# Patient Record
Sex: Female | Born: 1970 | Race: Black or African American | Hispanic: No | Marital: Single | Smoking: Never smoker
Health system: Southern US, Community
[De-identification: ages and names within clinical notes are randomized; demographics above are authoritative.]

## PROBLEM LIST (undated history)

## (undated) DIAGNOSIS — E039 Hypothyroidism, unspecified: Secondary | ICD-10-CM

---

## 1998-03-03 ENCOUNTER — Other Ambulatory Visit: Admission: RE | Admit: 1998-03-03 | Discharge: 1998-03-03 | Payer: Self-pay | Admitting: Family Medicine

## 1998-12-22 ENCOUNTER — Other Ambulatory Visit: Admission: RE | Admit: 1998-12-22 | Discharge: 1998-12-22 | Payer: Self-pay | Admitting: Family Medicine

## 1999-10-26 ENCOUNTER — Emergency Department (HOSPITAL_COMMUNITY): Admission: EM | Admit: 1999-10-26 | Discharge: 1999-10-26 | Payer: Self-pay | Admitting: Emergency Medicine

## 1999-12-29 ENCOUNTER — Other Ambulatory Visit: Admission: RE | Admit: 1999-12-29 | Discharge: 1999-12-29 | Payer: Self-pay | Admitting: Family Medicine

## 2000-03-25 ENCOUNTER — Other Ambulatory Visit: Admission: RE | Admit: 2000-03-25 | Discharge: 2000-03-25 | Payer: Self-pay | Admitting: Family Medicine

## 2002-10-21 ENCOUNTER — Encounter: Admission: RE | Admit: 2002-10-21 | Discharge: 2002-10-21 | Payer: Self-pay | Admitting: Gastroenterology

## 2002-10-21 ENCOUNTER — Encounter: Payer: Self-pay | Admitting: Gastroenterology

## 2004-06-21 ENCOUNTER — Other Ambulatory Visit: Admission: RE | Admit: 2004-06-21 | Discharge: 2004-06-21 | Payer: Self-pay | Admitting: Family Medicine

## 2004-06-22 ENCOUNTER — Other Ambulatory Visit: Admission: RE | Admit: 2004-06-22 | Discharge: 2004-06-22 | Payer: Self-pay | Admitting: Family Medicine

## 2004-12-22 ENCOUNTER — Ambulatory Visit: Payer: Self-pay | Admitting: Family Medicine

## 2005-08-01 ENCOUNTER — Ambulatory Visit: Payer: Self-pay | Admitting: Family Medicine

## 2005-08-17 ENCOUNTER — Encounter: Payer: Self-pay | Admitting: Family Medicine

## 2005-08-17 ENCOUNTER — Ambulatory Visit: Payer: Self-pay | Admitting: Family Medicine

## 2005-08-17 ENCOUNTER — Other Ambulatory Visit: Admission: RE | Admit: 2005-08-17 | Discharge: 2005-08-17 | Payer: Self-pay | Admitting: Family Medicine

## 2006-07-03 ENCOUNTER — Ambulatory Visit: Payer: Self-pay | Admitting: Internal Medicine

## 2006-07-04 ENCOUNTER — Ambulatory Visit: Payer: Self-pay | Admitting: Cardiology

## 2006-07-05 ENCOUNTER — Ambulatory Visit: Payer: Self-pay | Admitting: Internal Medicine

## 2006-10-24 ENCOUNTER — Ambulatory Visit: Payer: Self-pay | Admitting: Family Medicine

## 2007-02-10 ENCOUNTER — Ambulatory Visit: Payer: Self-pay | Admitting: Family Medicine

## 2007-02-10 ENCOUNTER — Encounter (INDEPENDENT_AMBULATORY_CARE_PROVIDER_SITE_OTHER): Payer: Self-pay | Admitting: *Deleted

## 2007-02-10 ENCOUNTER — Other Ambulatory Visit: Admission: RE | Admit: 2007-02-10 | Discharge: 2007-02-10 | Payer: Self-pay | Admitting: Family Medicine

## 2007-02-10 LAB — CONVERTED CEMR LAB
ALT: 13 units/L (ref 0–40)
AST: 16 units/L (ref 0–37)
Albumin: 3.9 g/dL (ref 3.5–5.2)
Alkaline Phosphatase: 52 units/L (ref 39–117)
BUN: 10 mg/dL (ref 6–23)
Basophils Absolute: 0 10*3/uL (ref 0.0–0.1)
Basophils Relative: 0.1 % (ref 0.0–1.0)
Bilirubin, Direct: 0.1 mg/dL (ref 0.0–0.3)
CO2: 29 meq/L (ref 19–32)
Calcium: 9 mg/dL (ref 8.4–10.5)
Chloride: 108 meq/L (ref 96–112)
Cholesterol: 143 mg/dL (ref 0–200)
Creatinine, Ser: 0.7 mg/dL (ref 0.4–1.2)
Eosinophils Absolute: 0.1 10*3/uL (ref 0.0–0.6)
Eosinophils Relative: 2.6 % (ref 0.0–5.0)
GFR calc Af Amer: 122 mL/min
GFR calc non Af Amer: 101 mL/min
Glucose, Bld: 87 mg/dL (ref 70–99)
HCT: 38.5 % (ref 36.0–46.0)
HDL: 33.6 mg/dL — ABNORMAL LOW (ref >39.0)
Hemoglobin: 13.3 g/dL (ref 12.0–15.0)
LDL Cholesterol: 94 mg/dL (ref 0–99)
Lymphocytes Relative: 45.4 % (ref 12.0–46.0)
MCHC: 34.6 g/dL (ref 30.0–36.0)
MCV: 91.3 fL (ref 78.0–100.0)
Monocytes Absolute: 0.4 10*3/uL (ref 0.2–0.7)
Monocytes Relative: 7.2 % (ref 3.0–11.0)
Neutro Abs: 2.4 10*3/uL (ref 1.4–7.7)
Neutrophils Relative %: 44.7 % (ref 43.0–77.0)
Platelets: 309 10*3/uL (ref 150–400)
Potassium: 3.9 meq/L (ref 3.5–5.1)
RBC: 4.21 M/uL (ref 3.87–5.11)
RDW: 12 % (ref 11.5–14.6)
Sodium: 141 meq/L (ref 135–145)
TSH: 0.74 microintl units/mL (ref 0.35–5.50)
Total Bilirubin: 0.6 mg/dL (ref 0.3–1.2)
Total CHOL/HDL Ratio: 4.3
Total Protein: 7.1 g/dL (ref 6.0–8.3)
Triglycerides: 76 mg/dL (ref 0–149)
VLDL: 15 mg/dL (ref 0–40)
WBC: 5.3 10*3/uL (ref 4.5–10.5)

## 2007-07-14 ENCOUNTER — Ambulatory Visit (HOSPITAL_COMMUNITY): Admission: RE | Admit: 2007-07-14 | Discharge: 2007-07-14 | Payer: Self-pay | Admitting: Family Medicine

## 2007-07-16 ENCOUNTER — Encounter (INDEPENDENT_AMBULATORY_CARE_PROVIDER_SITE_OTHER): Payer: Self-pay | Admitting: *Deleted

## 2007-10-23 ENCOUNTER — Encounter: Admission: RE | Admit: 2007-10-23 | Discharge: 2007-10-23 | Payer: Self-pay | Admitting: Family Medicine

## 2008-11-24 ENCOUNTER — Other Ambulatory Visit: Admission: RE | Admit: 2008-11-24 | Discharge: 2008-11-24 | Payer: Self-pay | Admitting: Family Medicine

## 2008-11-24 ENCOUNTER — Ambulatory Visit: Payer: Self-pay | Admitting: Family Medicine

## 2008-11-24 ENCOUNTER — Encounter: Payer: Self-pay | Admitting: Family Medicine

## 2008-11-24 DIAGNOSIS — A6000 Herpesviral infection of urogenital system, unspecified: Secondary | ICD-10-CM | POA: Insufficient documentation

## 2008-11-29 ENCOUNTER — Encounter (INDEPENDENT_AMBULATORY_CARE_PROVIDER_SITE_OTHER): Payer: Self-pay | Admitting: *Deleted

## 2010-02-02 ENCOUNTER — Ambulatory Visit: Payer: Self-pay | Admitting: Family Medicine

## 2010-02-02 DIAGNOSIS — E039 Hypothyroidism, unspecified: Secondary | ICD-10-CM | POA: Insufficient documentation

## 2010-02-02 DIAGNOSIS — J069 Acute upper respiratory infection, unspecified: Secondary | ICD-10-CM | POA: Insufficient documentation

## 2010-02-02 LAB — CONVERTED CEMR LAB
ALT: 11 units/L (ref 0–35)
AST: 14 units/L (ref 0–37)
Albumin: 4.4 g/dL (ref 3.5–5.2)
Alkaline Phosphatase: 52 units/L (ref 39–117)
BUN: 8 mg/dL (ref 6–23)
Basophils Absolute: 0 10*3/uL (ref 0.0–0.1)
Basophils Relative: 0.5 % (ref 0.0–3.0)
Bilirubin, Direct: 0 mg/dL (ref 0.0–0.3)
CO2: 29 meq/L (ref 19–32)
Calcium: 9.6 mg/dL (ref 8.4–10.5)
Chloride: 106 meq/L (ref 96–112)
Cholesterol: 142 mg/dL (ref 0–200)
Creatinine, Ser: 0.8 mg/dL (ref 0.4–1.2)
Eosinophils Absolute: 0.1 10*3/uL (ref 0.0–0.7)
Eosinophils Relative: 2.6 % (ref 0.0–5.0)
GFR calc non Af Amer: 103.11 mL/min (ref >60)
Glucose, Bld: 90 mg/dL (ref 70–99)
HCT: 40 % (ref 36.0–46.0)
HDL: 35.6 mg/dL — ABNORMAL LOW (ref >39.00)
Hemoglobin: 13.5 g/dL (ref 12.0–15.0)
LDL Cholesterol: 87 mg/dL (ref 0–99)
Lymphocytes Relative: 40.5 % (ref 12.0–46.0)
Lymphs Abs: 2.2 10*3/uL (ref 0.7–4.0)
MCHC: 33.8 g/dL (ref 30.0–36.0)
MCV: 91.3 fL (ref 78.0–100.0)
Monocytes Absolute: 0.4 10*3/uL (ref 0.1–1.0)
Monocytes Relative: 7.5 % (ref 3.0–12.0)
Neutro Abs: 2.6 10*3/uL (ref 1.4–7.7)
Neutrophils Relative %: 48.9 % (ref 43.0–77.0)
Platelets: 340 10*3/uL (ref 150.0–400.0)
Potassium: 4.5 meq/L (ref 3.5–5.1)
RBC: 4.38 M/uL (ref 3.87–5.11)
RDW: 14.2 % (ref 11.5–14.6)
Sodium: 141 meq/L (ref 135–145)
TSH: 0.6 microintl units/mL (ref 0.35–5.50)
Total Bilirubin: 0.1 mg/dL — ABNORMAL LOW (ref 0.3–1.2)
Total CHOL/HDL Ratio: 4
Total Protein: 7.7 g/dL (ref 6.0–8.3)
Triglycerides: 97 mg/dL (ref 0.0–149.0)
VLDL: 19.4 mg/dL (ref 0.0–40.0)
WBC: 5.3 10*3/uL (ref 4.5–10.5)

## 2010-02-07 ENCOUNTER — Telehealth: Payer: Self-pay | Admitting: Family Medicine

## 2010-02-20 ENCOUNTER — Ambulatory Visit: Payer: Self-pay | Admitting: Family Medicine

## 2010-02-20 ENCOUNTER — Other Ambulatory Visit: Admission: RE | Admit: 2010-02-20 | Discharge: 2010-02-20 | Payer: Self-pay | Admitting: Family Medicine

## 2010-02-20 DIAGNOSIS — R319 Hematuria, unspecified: Secondary | ICD-10-CM | POA: Insufficient documentation

## 2010-02-24 ENCOUNTER — Telehealth (INDEPENDENT_AMBULATORY_CARE_PROVIDER_SITE_OTHER): Payer: Self-pay | Admitting: *Deleted

## 2010-02-24 LAB — CONVERTED CEMR LAB: Pap Smear: NEGATIVE

## 2010-03-10 ENCOUNTER — Ambulatory Visit: Payer: Self-pay | Admitting: Family Medicine

## 2010-03-10 LAB — CONVERTED CEMR LAB
Blood in Urine, dipstick: NEGATIVE
Glucose, Urine, Semiquant: NEGATIVE
Nitrite: NEGATIVE
Specific Gravity, Urine: <1.005

## 2010-03-11 ENCOUNTER — Encounter: Payer: Self-pay | Admitting: Family Medicine

## 2010-06-14 ENCOUNTER — Emergency Department (HOSPITAL_COMMUNITY)
Admission: EM | Admit: 2010-06-14 | Discharge: 2010-06-14 | Payer: Self-pay | Source: Home / Self Care | Admitting: Emergency Medicine

## 2010-11-16 NOTE — Assessment & Plan Note (Signed)
Summary: pap only//lch   Vital Signs:  Patient profile:   40 year old female Weight:      147 pounds Pulse rate:   82 / minute Pulse rhythm:   regular BP sitting:   114 / 80  (left arm) Cuff size:   regular  Vitals Entered By: Army Fossa CMA (Feb 20, 2010 10:05 AM) CC: Pap only.   History of Present Illness: Pt here for pap only.    Current Medications (verified): 1)  Valtrex 1 Gm Tabs (Valacyclovir Hcl) .... Take One Tablet By Mouth Daily 2)  Synthroid 125 Mcg Tabs (Levothyroxine Sodium) .... Take One Tablet Daily 3)  Zyrtec Allergy 10 Mg Tabs (Cetirizine Hcl) .Marland Kitchen.. 1 By Mouth Once Daily As Needed 4)  Singulair 10 Mg Tabs (Montelukast Sodium) .Marland Kitchen.. 1 By Mouth Once Daily 5)  Nasonex 50 Mcg/act Susp (Mometasone Furoate) 6)  Astepro 0.15 % Soln (Azelastine Hcl)  Allergies: 1)  ! Penicillin  Past History:  Past medical, surgical, family and social histories (including risk factors) reviewed for relevance to current acute and chronic problems.  Past Medical History: Reviewed history from 11/24/2008 and no changes required.  Current Problems:  HERPES GENITALIS (ICD-054.10)  Past Surgical History: Reviewed history from 11/24/2008 and no changes required. Denies surgical history  Family History: Reviewed history from 02/02/2010 and no changes required. Family History Hypertension Family History of Arthritis  Social History: Reviewed history from 11/24/2008 and no changes required. Occupation: [employer] Single Never Smoked Alcohol use-yes Drug use-no Regular exercise-yes  Review of Systems      See HPI  Physical Exam  General:  Well-developed,well-nourished,in no acute distress; alert,appropriate and cooperative throughout examination Genitalia:  Pelvic Exam:        External: normal female genitalia without lesions or masses        Vagina: normal without lesions or masses        Cervix: normal without lesions or masses        Adnexa:  normal bimanual exam without masses or fullness        Uterus: normal by palpation        Pap smear: performed + white d/c ---no odor   Impression & Recommendations:  Problem # 1:  ROUTINE GYNECOLOGICAL EXAMINATION (ICD-V72.31) pap done   Problem # 2:  HEMATURIA (ICD-599.70) recheck urine 2 weeks  Complete Medication List: 1)  Valtrex 1 Gm Tabs (Valacyclovir hcl) .... Take one tablet by mouth daily 2)  Synthroid 125 Mcg Tabs (Levothyroxine sodium) .... Take one tablet daily 3)  Zyrtec Allergy 10 Mg Tabs (Cetirizine hcl) .Marland Kitchen.. 1 by mouth once daily as needed 4)  Singulair 10 Mg Tabs (Montelukast sodium) .Marland Kitchen.. 1 by mouth once daily 5)  Nasonex 50 Mcg/act Susp (Mometasone furoate) 6)  Astepro 0.15 % Soln (Azelastine hcl)  Patient Instructions: 1)  recheck urine dip in 2 weeks--  599.70

## 2010-11-16 NOTE — Progress Notes (Signed)
Summary: Pap Results  Phone Note Outgoing Call Call back at Home Phone [phone]   Call placed by: Shonna Chock,  Feb 24, 2010 11:38 AM Call placed to: Patient Summary of Call: Left message on machine for patient to return call when avaliable, Reason for call:   normal except + yeast ---diflucan 150 mg  #2  1 by mouth once daily x1---may repeat in 3 days as needed  Chrae Malloy  Feb 24, 2010 11:39 AM   Follow-up for Phone Call        Patient is aware of results.   Pharmacy is [facility] on [address]  Follow-up by: Harold Barban,  Feb 24, 2010 12:45 PM    New/Updated Medications: DIFLUCAN 150 MG TABS (FLUCONAZOLE) 1 by mouth x 1, may repeat in 3 days if needed Prescriptions: DIFLUCAN 150 MG TABS (FLUCONAZOLE) 1 by mouth x 1, may repeat in 3 days if needed  #2 x 0   Entered by:   Shonna Chock   Authorized by:   Loreen Freud DO   Signed by:   Shonna Chock on 02/24/2010   Method used:   Electronically to        [facility] [address] [id number]* (retail)       [address] [place], [place]  [address]       Ph: [phone]       Fax: [phone]   RxID:   [id number]

## 2010-11-16 NOTE — Progress Notes (Signed)
Summary: Urine Culture Results   Phone Note Outgoing Call   Call placed by: Army Fossa CMA,  February 07, 2010 9:19 AM Reason for Call: Discuss lab or test results Summary of Call: Regarding lab results, LMTCB:  contaminated---  recheck if symptomatic Signed by Loreen Freud DO on 02/06/2010 at 9:22 PM  Follow-up for Phone Call        PATIENT RETURNED CALL, STATES SHE HAS NO UTI, OR URINARY SYMPTOMS.  SAYS SHE WAS ON HER PERIOD WHEN SHE DID THE URINE SAMPLE.  PER DANIELLE, FOR THAT REASON, SHE DOESN'T NEED TO COME BACK & REDO URINE SAMPLE, PATIENT IS AWARE. Follow-up by: Magdalen Spatz [facility],  February 07, 2010 10:06 AM

## 2010-11-16 NOTE — Assessment & Plan Note (Signed)
Summary: cpx/kdc   Vital Signs:  Patient profile:   40 year old female Height:      65 inches Weight:      147 pounds BMI:     24.55 Pulse rate:   76 / minute Pulse rhythm:   regular BP sitting:   112 / 72  (left arm) Cuff size:   regular  Vitals Entered By: Army Fossa CMA (February 02, 2010 10:10 AM) CC: Pt here for cpx, no pap- on period, not fasting.   History of Present Illness: Pt here for cpe and labs.  Pt started period last night.    pt c/o clear chest congestion and cough for 2 weeks --she is taking zyrtec.   Preventive Screening-Counseling & Management  Alcohol-Tobacco     Alcohol drinks/day: <1     Alcohol type: rare     Smoking Status: never  Caffeine-Diet-Exercise     Caffeine use/day: 1     Does Patient Exercise: yes     Type of exercise: walking     Exercise (avg: min/session): 30-60     Times/week: <3  Hep-HIV-STD-Contraception     Dental Visit-last 6 months yes     Dental Care Counseling: not indicated; dental care within six months     SBE monthly: yes     SBE Education/Counseling: not indicated; SBE done regularly  Safety-Violence-Falls     Seat Belt Use: 100      Sexual History:  single --not sexually active.        Drug Use:  never.    Current Medications (verified): 1)  Valtrex 1 Gm Tabs (Valacyclovir Hcl) .... Take One Tablet By Mouth Daily 2)  Synthroid 125 Mcg Tabs (Levothyroxine Sodium) .... Take One Tablet Daily 3)  Zyrtec Allergy 10 Mg Tabs (Cetirizine Hcl) .Marland Kitchen.. 1 By Mouth Once Daily As Needed 4)  Singulair 10 Mg Tabs (Montelukast Sodium) .Marland Kitchen.. 1 By Mouth Once Daily 5)  Nasonex 50 Mcg/act Susp (Mometasone Furoate) 6)  Astepro 0.15 % Soln (Azelastine Hcl)  Allergies: 1)  ! Penicillin  Past History:  Past Medical History: Last updated: 11/24/2008  Current Problems:  HERPES GENITALIS (ICD-054.10)  Past Surgical History: Last updated: 11/24/2008 Denies surgical history  Family History: Last updated: 02/02/2010 Family  History Hypertension Family History of Arthritis  Social History: Last updated: 11/24/2008 Occupation: [employer] Single Never Smoked Alcohol use-yes Drug use-no Regular exercise-yes  Risk Factors: Alcohol Use: <1 (02/02/2010) Caffeine Use: 1 (02/02/2010) Exercise: yes (02/02/2010)  Risk Factors: Smoking Status: never (02/02/2010)  Family History: Reviewed history from 11/24/2008 and no changes required. Family History Hypertension Family History of Arthritis  Social History: Reviewed history from 11/24/2008 and no changes required. Occupation: [employer] Single Never Smoked Alcohol use-yes Drug use-no Regular exercise-yes Dental Care w/in 6 mos.:  yes Sexual History:  single --not sexually active Drug Use:  never  Review of Systems      See HPI General:  Denies chills, fatigue, fever, loss of appetite, malaise, sleep disorder, sweats, weakness, and weight loss. Eyes:  Denies blurring, discharge, double vision, eye irritation, eye pain, halos, itching, light sensitivity, red eye, vision loss-1 eye, and vision loss-both eyes; optho-- q2-3 years. ENT:  Complains of postnasal drainage; denies decreased hearing, difficulty swallowing, ear discharge, earache, hoarseness, nasal congestion, nosebleeds, ringing in ears, sinus pressure, and sore throat. CV:  Denies bluish discoloration of lips or nails, chest pain or discomfort, difficulty breathing at night, difficulty breathing while lying down, fainting, fatigue, leg  cramps with exertion, lightheadness, near fainting, palpitations, shortness of breath with exertion, swelling of feet, swelling of hands, and weight gain. Resp:  Complains of cough and sputum productive; denies chest discomfort, chest pain with inspiration, coughing up blood, excessive snoring, hypersomnolence, morning headaches, pleuritic, shortness of breath, and wheezing; sputum is white/ clear. GI:  Denies abdominal pain, bloody  stools, change in bowel habits, constipation, dark tarry stools, diarrhea, excessive appetite, gas, hemorrhoids, indigestion, loss of appetite, and nausea. GU:  Denies abnormal vaginal bleeding, decreased libido, discharge, dysuria, genital sores, hematuria, incontinence, nocturia, urinary frequency, and urinary hesitancy. MS:  Denies joint pain, joint redness, joint swelling, loss of strength, low back pain, mid back pain, muscle aches, muscle , cramps, muscle weakness, stiffness, and thoracic pain. Derm:  Denies changes in color of skin, changes in nail beds, dryness, excessive perspiration, flushing, hair loss, insect bite(s), itching, lesion(s), poor wound healing, and rash. Neuro:  Denies brief paralysis, difficulty with concentration, disturbances in coordination, falling down, headaches, inability to speak, memory loss, numbness, poor balance, seizures, sensation of room spinning, tingling, tremors, visual disturbances, and weakness. Psych:  Denies alternate hallucination ( auditory/visual), anxiety, depression, easily angered, easily tearful, irritability, mental problems, panic attacks, sense of great danger, suicidal thoughts/plans, thoughts of violence, unusual visions or sounds, and thoughts /plans of harming others. Endo:  Denies cold intolerance, excessive hunger, excessive thirst, excessive urination, heat intolerance, polyuria, and weight change. Heme:  Denies abnormal bruising, bleeding, enlarge lymph nodes, fevers, pallor, and skin discoloration. Allergy:  Denies hives or rash, itching eyes, persistent infections, seasonal allergies, and sneezing.  Physical Exam  General:  Well-developed,well-nourished,in no acute distress; alert,appropriate and cooperative throughout examination Head:  Normocephalic and atraumatic without obvious abnormalities. No apparent alopecia or balding. Eyes:  pupils equal, pupils round, pupils reactive to light, and no injection.   Ears:  External ear exam  shows no significant lesions or deformities.  Otoscopic examination reveals clear canals, tympanic membranes are intact bilaterally without bulging, retraction, inflammation or discharge. Hearing is grossly normal bilaterally. Nose:  External nasal examination shows no deformity or inflammation. Nasal mucosa are pink and moist without lesions or exudates. Mouth:  Oral mucosa and oropharynx without lesions or exudates.  Teeth in good repair. Neck:  No deformities, masses, or tenderness noted. Chest Wall:  No deformities, masses, or tenderness noted. Breasts:  No mass, nodules, thickening, tenderness, bulging, retraction, inflamation, nipple discharge or skin changes noted.   Lungs:  Normal respiratory effort, chest expands symmetrically. Lungs are clear to auscultation, no crackles or wheezes. Heart:  normal rate and no murmur.   Abdomen:  Bowel sounds positive,abdomen soft and non-tender without masses, organomegaly or hernias noted. Genitalia:  deferred Msk:  No deformity or scoliosis noted of thoracic or lumbar spine.   Pulses:  R posterior tibial normal, R dorsalis pedis normal, R carotid normal, L posterior tibial normal, L dorsalis pedis normal, and L carotid normal.   Extremities:  No clubbing, cyanosis, edema, or deformity noted with normal full range of motion of all joints.   Neurologic:  alert & oriented X3, strength normal in all extremities, and gait normal.   Skin:  Intact without suspicious lesions or rashes Cervical Nodes:  No lymphadenopathy noted Axillary Nodes:  No palpable lymphadenopathy Psych:  Cognition and judgment appear intact. Alert and cooperative with normal attention span and concentration. No apparent delusions, illusions, hallucinations   Impression & Recommendations:  Problem # 1:  PREVENTIVE HEALTH CARE (ICD-V70.0) ghm utd rto pap 2-3 weeks  Orders: Venipuncture (21308) TLB-Lipid Panel (80061-LIPID) TLB-BMP (Basic Metabolic Panel-BMET)  (80048-METABOL) TLB-CBC Platelet - w/Differential (85025-CBCD) TLB-Hepatic/Liver Function Pnl (80076-HEPATIC) TLB-TSH (Thyroid Stimulating Hormone) (84443-TSH)  Problem # 2:  HERPES GENITALIS (ICD-054.10) refill valtrex  Problem # 3:  HYPOTHYROIDISM (ICD-244.9)  Her updated medication list for this problem includes:    Synthroid 125 Mcg Tabs (Levothyroxine sodium) .Marland Kitchen... Take one tablet daily  Labs Reviewed: TSH: 0.74 (02/10/2007)    Chol: 143 (02/10/2007)   HDL: 33.6 (02/10/2007)   LDL: 94 (02/10/2007)   TG: 76 (02/10/2007)  Problem # 4:  URI (ICD-465.9)  ? allergies---  nasonex. astepro, singulair---con't zyrtec Her updated medication list for this problem includes:    Zyrtec Allergy 10 Mg Tabs (Cetirizine hcl) .Marland Kitchen... 1 by mouth once daily as needed  Instructed on symptomatic treatment. Call if symptoms persist or worsen.   Complete Medication List: 1)  Valtrex 1 Gm Tabs (Valacyclovir hcl) .... Take one tablet by mouth daily 2)  Synthroid 125 Mcg Tabs (Levothyroxine sodium) .... Take one tablet daily 3)  Zyrtec Allergy 10 Mg Tabs (Cetirizine hcl) .Marland Kitchen.. 1 by mouth once daily as needed 4)  Singulair 10 Mg Tabs (Montelukast sodium) .Marland Kitchen.. 1 by mouth once daily 5)  Nasonex 50 Mcg/act Susp (Mometasone furoate) 6)  Astepro 0.15 % Soln (Azelastine hcl) Prescriptions: VALTREX 1 GM TABS (VALACYCLOVIR HCL) TAKE ONE TABLET BY MOUTH DAILY  #60 Tablet x 5   Entered and Authorized by:   Loreen Freud DO   Signed by:   Loreen Freud DO on 02/02/2010   Method used:   Electronically to        [facility] [phone]* (retail)       [address], [place]  [address]       Ph: [phone] or [phone]       Fax: [phone]   RxID:   [id number]   TD Result Date:  01/24/2005    Immunization History:  Tetanus/Td Immunization History:    Tetanus/Td:  Historical (01/24/2005)  Appended Document: cpx/kdc  Laboratory Results   Urine Tests   Date/Time Reported: February 02, 2010 12:47 PM   Routine Urinalysis   Color: yellow Appearance: Clear Glucose: negative   (Normal Range: Negative) Bilirubin: negative   (Normal Range: Negative) Ketone: negative   (Normal Range: Negative) Spec. Gravity: >=1.030   (Normal Range: 1.003-1.035) Blood: large   (Normal Range: Negative) pH: 5.0   (Normal Range: 5.0-8.0) Protein: 30   (Normal Range: Negative) Urobilinogen: negative   (Normal Range: 0-1) Nitrite: negative   (Normal Range: Negative) Leukocyte Esterace: negative   (Normal Range: Negative)    Comments: Floydene Flock  February 02, 2010 12:48 PM cx sent

## 2011-02-23 ENCOUNTER — Ambulatory Visit (INDEPENDENT_AMBULATORY_CARE_PROVIDER_SITE_OTHER): Payer: BC Managed Care – PPO | Admitting: Family Medicine

## 2011-02-23 ENCOUNTER — Encounter: Payer: Self-pay | Admitting: Family Medicine

## 2011-02-23 ENCOUNTER — Other Ambulatory Visit (HOSPITAL_COMMUNITY)
Admission: RE | Admit: 2011-02-23 | Discharge: 2011-02-23 | Disposition: A | Discharge status: Home / Self Care | Payer: BC Managed Care – PPO | Source: Ambulatory Visit | Attending: Family Medicine | Admitting: Family Medicine

## 2011-02-23 VITALS — BP 114/76 | HR 89 | Temp 98.7°F | Wt 150.8 lb

## 2011-02-23 DIAGNOSIS — Z01419 Encounter for gynecological examination (general) (routine) without abnormal findings: Secondary | ICD-10-CM

## 2011-02-23 DIAGNOSIS — A6000 Herpesviral infection of urogenital system, unspecified: Secondary | ICD-10-CM

## 2011-02-23 MED ORDER — VALACYCLOVIR HCL 1 G PO TABS: 1000.0000 mg | ORAL_TABLET | Freq: Every day | ORAL | Status: DC

## 2011-02-23 NOTE — Progress Notes (Signed)
  Subjective:     Lindsay Avila is a 40 y.o. female and is here for a comprehensive physical exam. The patient reports no problems.  History   Social History  . Marital Status: Single    Spouse Name: N/A    Number of Children: N/A  . Years of Education: N/A   Occupational History  . Not on file.   Social History Main Topics  . Smoking status: Never Smoker   . Smokeless tobacco: Never Used  . Alcohol Use: Yes  . Drug Use: No  . Sexually Active: Yes   Other Topics Concern  . Not on file   Social History Narrative  . No narrative on file   Health Maintenance  Topic Date Due  . Pap Smear  11/11/1989  . Tetanus/tdap  01/25/2015    The following portions of the patient's history were reviewed and updated as appropriate: allergies, current medications, past family history, past medical history, past social history, past surgical history and problem list.  Review of Systems Review of Systems  Constitutional: Negative for activity change, appetite change and fatigue.  HENT: Negative for hearing loss, congestion, tinnitus and ear discharge.   Eyes: Negative for visual disturbance  Respiratory: Negative for cough, chest tightness and shortness of breath.   Cardiovascular: Negative for chest pain, palpitations and leg swelling.  Gastrointestinal: Negative for abdominal pain, diarrhea, constipation and abdominal distention.  Genitourinary: Negative for urgency, frequency, decreased urine volume and difficulty urinating.  Musculoskeletal: Negative for back pain, arthralgias and gait problem.  Skin: Negative for color change, pallor and rash.  Neurological: Negative for dizziness, light-headedness, numbness and headaches.  Hematological: Negative for adenopathy. Does not bruise/bleed easily.  Psychiatric/Behavioral: Negative for suicidal ideas, confusion, sleep disturbance, self-injury, dysphoric mood, decreased concentration and agitation.      Objective:    BP 114/76   Pulse 89  Temp(Src) 98.7 F (37.1 C) (Oral)  Wt 150 lb 12.8 oz (68.402 kg)  SpO2 98%  LMP 02/17/2011 General appearance: alert, cooperative, appears stated age and no distress Breasts: normal appearance, no masses or tenderness Pelvic: cervix normal in appearance, external genitalia normal, no adnexal masses or tenderness, no cervical motion tenderness, rectovaginal septum normal, uterus normal size, shape, and consistency and vagina normal without discharge    Assessment:    Healthy female exam.    Plan:    pap smear and breast exam only --at pt request  pt did not want labs either See After Visit Summary for Counseling Recommendations

## 2011-02-28 ENCOUNTER — Encounter: Payer: Self-pay | Admitting: *Deleted

## 2011-03-13 ENCOUNTER — Other Ambulatory Visit: Payer: Self-pay

## 2011-03-13 ENCOUNTER — Telehealth: Payer: Self-pay | Admitting: *Deleted

## 2011-03-13 MED ORDER — FLUCONAZOLE 150 MG PO TABS: ORAL_TABLET | ORAL | Status: DC

## 2011-03-13 NOTE — Telephone Encounter (Signed)
Diflucan 150mg #2  1 po qd x1, may repeat 3 days prn  

## 2011-03-13 NOTE — Telephone Encounter (Signed)
This time only---wants to use different pharmacy---please send this prescription to [facility] at [address] of [address], [place]

## 2011-03-13 NOTE — Telephone Encounter (Signed)
Pt states that she was told to call if symptoms did not improve. Pt now c/o yeast infection and would like to have med Rx. Pt seen on 02-23-11 for CPX. Please advise

## 2011-03-13 NOTE — Telephone Encounter (Signed)
Left Pt detail message Rx sent to pharmacy.  

## 2011-03-14 MED ORDER — FLUCONAZOLE 150 MG PO TABS: ORAL_TABLET | ORAL | Status: DC

## 2011-03-14 NOTE — Telephone Encounter (Signed)
Refaxed   KP 

## 2011-03-14 NOTE — Telephone Encounter (Signed)
Addended by: Arnette Norris on: 03/14/2011 04:48 PM   Modules accepted: Orders

## 2012-01-10 ENCOUNTER — Other Ambulatory Visit (HOSPITAL_COMMUNITY): Payer: Self-pay | Admitting: Nurse Practitioner

## 2012-01-10 DIAGNOSIS — Z1231 Encounter for screening mammogram for malignant neoplasm of breast: Secondary | ICD-10-CM

## 2012-01-11 ENCOUNTER — Ambulatory Visit (HOSPITAL_COMMUNITY)
Admission: RE | Admit: 2012-01-11 | Discharge: 2012-01-11 | Disposition: A | Discharge status: Home / Self Care | Payer: BC Managed Care – PPO | Source: Ambulatory Visit | Attending: Nurse Practitioner | Admitting: Nurse Practitioner

## 2012-01-11 DIAGNOSIS — Z1231 Encounter for screening mammogram for malignant neoplasm of breast: Secondary | ICD-10-CM | POA: Insufficient documentation

## 2012-07-10 ENCOUNTER — Encounter (HOSPITAL_COMMUNITY): Payer: Self-pay | Admitting: Pharmacist

## 2012-07-11 NOTE — H&P (Signed)
Lindsay Avila is an 41 y.o. female.   Chief Complaint: bleeding   HPI: 41 yo SBF G0P0 with menorrhagia and large fibroids, for R-TLH.  PUS showed uterus 9 x 8 x 17 cm with a 9 cm fibroid.  Adnexa normal.  Past Medical History  Diagnosis Date  . Genital herpes     No past surgical history on file.  Family History  Problem Relation Age of Onset  . Hypertension    . Arthritis     Social History:  reports that she has never smoked. She has never used smokeless tobacco. She reports that she drinks alcohol. She reports that she does not use illicit drugs.works at a part time job unloading trucks, which involves heavy lifting at times.  She also works as a Surveyor, mining.  Allergies:  Allergies  Allergen Reactions  . Penicillins     No prescriptions prior to admission    No results found for this or any previous visit (from the past 48 hour(s)). No results found.  Review of Systems  All other systems reviewed and are negative.    There were no vitals taken for this visit. Physical Exam  Constitutional: She is oriented to person, place, and time. She appears well-developed and well-nourished.  HENT:  Head: Normocephalic and atraumatic.  Eyes: Conjunctivae normal are normal.  Neck: No thyromegaly present.  Cardiovascular: Normal rate, regular rhythm and normal heart sounds.   Respiratory: Effort normal and breath sounds normal.  GI: Soft. She exhibits mass. She exhibits no distension. There is no tenderness.       Uterus palpable just at umbilicus  Genitourinary: Vagina normal.       Uterus at umbilicus.  Adnexa not palpable.  Musculoskeletal: Normal range of motion.  Neurological: She is alert and oriented to person, place, and time.  Skin: Skin is warm and dry.  Psychiatric: Her behavior is normal. Thought content normal.     Assessment/Plan Large fibroid uterus.  Plan R-TLH, cystoscopy.  ROMINE,CYNTHIA P 07/11/2012, 12:23 PM

## 2012-07-21 ENCOUNTER — Encounter (HOSPITAL_COMMUNITY)
Admission: RE | Admit: 2012-07-21 | Discharge: 2012-07-21 | Disposition: A | Discharge status: Home / Self Care | Payer: BC Managed Care – PPO | Source: Ambulatory Visit | Attending: Obstetrics and Gynecology | Admitting: Obstetrics and Gynecology

## 2012-07-21 ENCOUNTER — Encounter (HOSPITAL_COMMUNITY): Payer: Self-pay

## 2012-07-21 HISTORY — DX: Hypothyroidism, unspecified: E03.9

## 2012-07-21 LAB — CBC
HCT: 40 % (ref 36.0–46.0)
MCH: 28.8 pg (ref 26.0–34.0)
MCHC: 33 g/dL (ref 30.0–36.0)
MCV: 87.3 fL (ref 78.0–100.0)
Platelets: 338 10*3/uL (ref 150–400)
RDW: 12.8 % (ref 11.5–15.5)

## 2012-07-21 NOTE — Patient Instructions (Addendum)
20 Lindsay Avila  07/21/2012   Your procedure is scheduled on:  07/28/12  Enter through the Main Entrance of [facility] at 6 AM.  Pick up the phone at the desk and dial [phone].   Call this number if you have problems the morning of surgery: [phone]   Remember:   Do not eat food:After Midnight.  Do not drink clear liquids: After Midnight.  Take these medicines the morning of surgery with A SIP OF WATER: Thyroid medication   Do not wear jewelry, make-up or nail polish.  Do not wear lotions, powders, or perfumes. You may wear deodorant.  Do not shave 48 hours prior to surgery.  Do not bring valuables to the hospital.  Contacts, dentures or bridgework may not be worn into surgery.  Leave suitcase in the car. After surgery it may be brought to your room.  For patients admitted to the hospital, checkout time is 11:00 AM the day of discharge.   Patients discharged the day of surgery will not be allowed to drive home.  Name and phone number of your driver: NA  Special Instructions: Shower using CHG 2 nights before surgery and the night before surgery.  If you shower the day of surgery use CHG.  Use special wash - you have one bottle of CHG for all showers.  You should use approximately 1/3 of the bottle for each shower.   Please read over the following fact sheets that you were given: MRSA Information

## 2012-07-27 MED ORDER — CIPROFLOXACIN IN D5W 400 MG/200ML IV SOLN
400.0000 mg | INTRAVENOUS | Status: AC
  Administered 2012-07-28: 400 mg via INTRAVENOUS
  Filled 2012-07-27: qty 200

## 2012-07-27 MED ORDER — METRONIDAZOLE IN NACL 5-0.79 MG/ML-% IV SOLN
500.0000 mg | Freq: Once | INTRAVENOUS | Status: AC
  Administered 2012-07-28: 500 mg via INTRAVENOUS
  Filled 2012-07-27: qty 100

## 2012-07-28 ENCOUNTER — Ambulatory Visit (HOSPITAL_COMMUNITY)
Admission: RE | Admit: 2012-07-28 | Discharge: 2012-07-28 | Disposition: A | Discharge status: Home / Self Care | Payer: BC Managed Care – PPO | Source: Ambulatory Visit | Attending: Obstetrics and Gynecology | Admitting: Obstetrics and Gynecology

## 2012-07-28 ENCOUNTER — Encounter (HOSPITAL_COMMUNITY): Payer: Self-pay | Admitting: *Deleted

## 2012-07-28 ENCOUNTER — Encounter (HOSPITAL_COMMUNITY): Payer: Self-pay | Admitting: Registered Nurse

## 2012-07-28 ENCOUNTER — Ambulatory Visit (HOSPITAL_COMMUNITY): Payer: BC Managed Care – PPO | Admitting: Registered Nurse

## 2012-07-28 ENCOUNTER — Encounter (HOSPITAL_COMMUNITY)
Admission: RE | Disposition: A | Discharge status: Home / Self Care | Payer: Self-pay | Source: Ambulatory Visit | Attending: Obstetrics and Gynecology

## 2012-07-28 DIAGNOSIS — N80109 Endometriosis of ovary, unspecified side, unspecified depth: Secondary | ICD-10-CM | POA: Insufficient documentation

## 2012-07-28 DIAGNOSIS — D259 Leiomyoma of uterus, unspecified: Secondary | ICD-10-CM | POA: Insufficient documentation

## 2012-07-28 DIAGNOSIS — N801 Endometriosis of ovary: Secondary | ICD-10-CM | POA: Insufficient documentation

## 2012-07-28 DIAGNOSIS — N920 Excessive and frequent menstruation with regular cycle: Secondary | ICD-10-CM | POA: Insufficient documentation

## 2012-07-28 HISTORY — PX: ABDOMINAL HYSTERECTOMY: SHX81

## 2012-07-28 HISTORY — PX: CYSTOSCOPY: SHX5120

## 2012-07-28 LAB — HCG, SERUM, QUALITATIVE: Preg, Serum: NEGATIVE

## 2012-07-28 SURGERY — ROBOTIC ASSISTED TOTAL HYSTERECTOMY
Anesthesia: General | Site: Bladder | Wound class: Clean Contaminated

## 2012-07-28 MED ORDER — DEXAMETHASONE SODIUM PHOSPHATE 10 MG/ML IJ SOLN
INTRAMUSCULAR | Status: DC | PRN
  Administered 2012-07-28: 10 mg via INTRAVENOUS

## 2012-07-28 MED ORDER — FENTANYL CITRATE 0.05 MG/ML IJ SOLN
INTRAMUSCULAR | Status: AC
  Filled 2012-07-28: qty 5

## 2012-07-28 MED ORDER — LIDOCAINE HCL (CARDIAC) 20 MG/ML IV SOLN
INTRAVENOUS | Status: AC
  Filled 2012-07-28: qty 5

## 2012-07-28 MED ORDER — KETOROLAC TROMETHAMINE 30 MG/ML IJ SOLN: 15.0000 mg | Freq: Once | INTRAMUSCULAR | Status: DC | PRN

## 2012-07-28 MED ORDER — ARTIFICIAL TEARS OP OINT
TOPICAL_OINTMENT | OPHTHALMIC | Status: DC | PRN
  Administered 2012-07-28: 1 via OPHTHALMIC

## 2012-07-28 MED ORDER — MIDAZOLAM HCL 2 MG/2ML IJ SOLN
INTRAMUSCULAR | Status: AC
  Filled 2012-07-28: qty 2

## 2012-07-28 MED ORDER — ACETAMINOPHEN 10 MG/ML IV SOLN
1000.0000 mg | Freq: Once | INTRAVENOUS | Status: DC
  Filled 2012-07-28: qty 100

## 2012-07-28 MED ORDER — OXYCODONE-ACETAMINOPHEN 5-325 MG PO TABS
1.0000 | ORAL_TABLET | ORAL | Status: DC | PRN
  Administered 2012-07-28 (×2): 2 via ORAL
  Filled 2012-07-28 (×2): qty 2

## 2012-07-28 MED ORDER — ONDANSETRON HCL 4 MG/2ML IJ SOLN
INTRAMUSCULAR | Status: AC
  Filled 2012-07-28: qty 2

## 2012-07-28 MED ORDER — DEXTROSE IN LACTATED RINGERS 5 % IV SOLN: INTRAVENOUS | Status: DC

## 2012-07-28 MED ORDER — FENTANYL CITRATE 0.05 MG/ML IJ SOLN
INTRAMUSCULAR | Status: AC
  Administered 2012-07-28: 50 ug via INTRAVENOUS
  Filled 2012-07-28: qty 2

## 2012-07-28 MED ORDER — STERILE WATER FOR IRRIGATION IR SOLN
Status: DC | PRN
  Administered 2012-07-28: 200 mL via INTRAVESICAL

## 2012-07-28 MED ORDER — INFLUENZA VIRUS VACC SPLIT PF IM SUSP
0.5000 mL | Freq: Once | INTRAMUSCULAR | Status: AC
  Administered 2012-07-28: 0.5 mL via INTRAMUSCULAR
  Filled 2012-07-28: qty 0.5

## 2012-07-28 MED ORDER — PROPOFOL 10 MG/ML IV EMUL
INTRAVENOUS | Status: AC
  Filled 2012-07-28: qty 20

## 2012-07-28 MED ORDER — GLYCOPYRROLATE 0.2 MG/ML IJ SOLN
INTRAMUSCULAR | Status: AC
  Filled 2012-07-28: qty 3

## 2012-07-28 MED ORDER — ONDANSETRON HCL 4 MG/2ML IJ SOLN
INTRAMUSCULAR | Status: DC | PRN
  Administered 2012-07-28: 4 mg via INTRAVENOUS

## 2012-07-28 MED ORDER — FENTANYL CITRATE 0.05 MG/ML IJ SOLN
INTRAMUSCULAR | Status: DC | PRN
  Administered 2012-07-28: 25 ug via INTRAVENOUS
  Administered 2012-07-28: 150 ug via INTRAVENOUS
  Administered 2012-07-28: 25 ug via INTRAVENOUS
  Administered 2012-07-28: 50 ug via INTRAVENOUS

## 2012-07-28 MED ORDER — ACETAMINOPHEN 10 MG/ML IV SOLN
1000.0000 mg | Freq: Once | INTRAVENOUS | Status: AC
  Administered 2012-07-28: 1000 mg via INTRAVENOUS
  Filled 2012-07-28: qty 100

## 2012-07-28 MED ORDER — LACTATED RINGERS IR SOLN
Status: DC | PRN
  Administered 2012-07-28: 3000 mL

## 2012-07-28 MED ORDER — ARTIFICIAL TEARS OP OINT
TOPICAL_OINTMENT | OPHTHALMIC | Status: AC
  Filled 2012-07-28: qty 3.5

## 2012-07-28 MED ORDER — NEOSTIGMINE METHYLSULFATE 1 MG/ML IJ SOLN
INTRAMUSCULAR | Status: AC
  Filled 2012-07-28: qty 10

## 2012-07-28 MED ORDER — INDIGOTINDISULFONATE SODIUM 8 MG/ML IJ SOLN
INTRAMUSCULAR | Status: AC
  Filled 2012-07-28: qty 5

## 2012-07-28 MED ORDER — LIDOCAINE HCL (CARDIAC) 20 MG/ML IV SOLN
INTRAVENOUS | Status: DC | PRN
  Administered 2012-07-28: 100 mg via INTRAVENOUS

## 2012-07-28 MED ORDER — SODIUM CHLORIDE 0.9 % IJ SOLN
INTRAMUSCULAR | Status: DC | PRN
  Administered 2012-07-28: 10 mL

## 2012-07-28 MED ORDER — PROPOFOL 10 MG/ML IV BOLUS
INTRAVENOUS | Status: DC | PRN
  Administered 2012-07-28: 200 mg via INTRAVENOUS

## 2012-07-28 MED ORDER — LACTATED RINGERS IV SOLN
INTRAVENOUS | Status: DC
  Administered 2012-07-28 (×2): via INTRAVENOUS

## 2012-07-28 MED ORDER — ROPIVACAINE HCL 5 MG/ML IJ SOLN
INTRAMUSCULAR | Status: AC
  Filled 2012-07-28: qty 60

## 2012-07-28 MED ORDER — NEOSTIGMINE METHYLSULFATE 1 MG/ML IJ SOLN
INTRAMUSCULAR | Status: DC | PRN
  Administered 2012-07-28: 3 mg via INTRAVENOUS

## 2012-07-28 MED ORDER — ROCURONIUM BROMIDE 100 MG/10ML IV SOLN
INTRAVENOUS | Status: DC | PRN
  Administered 2012-07-28 (×2): 10 mg via INTRAVENOUS
  Administered 2012-07-28: 20 mg via INTRAVENOUS
  Administered 2012-07-28: 60 mg via INTRAVENOUS

## 2012-07-28 MED ORDER — VASOPRESSIN 20 UNIT/ML IJ SOLN
INTRAMUSCULAR | Status: AC
  Filled 2012-07-28: qty 1

## 2012-07-28 MED ORDER — ROPIVACAINE HCL 5 MG/ML IJ SOLN
INTRAMUSCULAR | Status: DC | PRN
  Administered 2012-07-28: 120 mL

## 2012-07-28 MED ORDER — FENTANYL CITRATE 0.05 MG/ML IJ SOLN
25.0000 ug | INTRAMUSCULAR | Status: DC | PRN
  Administered 2012-07-28: 50 ug via INTRAVENOUS
  Administered 2012-07-28: 25 ug via INTRAVENOUS

## 2012-07-28 MED ORDER — GLYCOPYRROLATE 0.2 MG/ML IJ SOLN
INTRAMUSCULAR | Status: DC | PRN
  Administered 2012-07-28: 0.6 mg via INTRAVENOUS

## 2012-07-28 MED ORDER — DEXAMETHASONE SODIUM PHOSPHATE 10 MG/ML IJ SOLN
INTRAMUSCULAR | Status: AC
  Filled 2012-07-28: qty 1

## 2012-07-28 MED ORDER — ACETAMINOPHEN 10 MG/ML IV SOLN
INTRAVENOUS | Status: AC
  Filled 2012-07-28: qty 100

## 2012-07-28 MED ORDER — INDIGOTINDISULFONATE SODIUM 8 MG/ML IJ SOLN
INTRAMUSCULAR | Status: DC | PRN
  Administered 2012-07-28: 5 mL via INTRAVENOUS

## 2012-07-28 MED ORDER — MIDAZOLAM HCL 5 MG/5ML IJ SOLN
INTRAMUSCULAR | Status: DC | PRN
  Administered 2012-07-28: 2 mg via INTRAVENOUS

## 2012-07-28 MED ORDER — ROCURONIUM BROMIDE 50 MG/5ML IV SOLN
INTRAVENOUS | Status: AC
  Filled 2012-07-28: qty 1

## 2012-07-28 SURGICAL SUPPLY — 52 items
BARRIER ADHS 3X4 INTERCEED (GAUZE/BANDAGES/DRESSINGS) ×4 IMPLANT
BLADE MORCELLATOR EXT  12.5X15 (ELECTROSURGICAL) ×1
BLADE MORCELLATOR EXT 12.5X15 (ELECTROSURGICAL) ×3 IMPLANT
CABLE HIGH FREQUENCY MONO STRZ (ELECTRODE) ×4 IMPLANT
CHLORAPREP W/TINT 26ML (MISCELLANEOUS) ×4 IMPLANT
CONT PATH 16OZ SNAP LID 3702 (MISCELLANEOUS) ×4 IMPLANT
COVER MAYO STAND STRL (DRAPES) ×4 IMPLANT
COVER TABLE BACK 60X90 (DRAPES) ×8 IMPLANT
COVER TIP SHEARS 8 DVNC (MISCELLANEOUS) ×3 IMPLANT
COVER TIP SHEARS 8MM DA VINCI (MISCELLANEOUS) ×1
DECANTER SPIKE VIAL GLASS SM (MISCELLANEOUS) ×8 IMPLANT
DERMABOND ADVANCED (GAUZE/BANDAGES/DRESSINGS) ×1
DERMABOND ADVANCED .7 DNX12 (GAUZE/BANDAGES/DRESSINGS) ×3 IMPLANT
DRAPE HUG U DISPOSABLE (DRAPE) ×4 IMPLANT
DRAPE LG THREE QUARTER DISP (DRAPES) ×8 IMPLANT
DRAPE WARM FLUID 44X44 (DRAPE) ×4 IMPLANT
ELECT REM PT RETURN 9FT ADLT (ELECTROSURGICAL) ×4
ELECTRODE REM PT RTRN 9FT ADLT (ELECTROSURGICAL) ×3 IMPLANT
EVACUATOR SMOKE 8.L (FILTER) ×4 IMPLANT
GLOVE BIOGEL PI IND STRL 7.0 (GLOVE) ×12 IMPLANT
GLOVE BIOGEL PI INDICATOR 7.0 (GLOVE) ×4
GLOVE ECLIPSE 6.5 STRL STRAW (GLOVE) ×28 IMPLANT
GOWN STRL REIN XL XLG (GOWN DISPOSABLE) ×32 IMPLANT
KIT ACCESSORY DA VINCI DISP (KITS) ×1
KIT ACCESSORY DVNC DISP (KITS) ×3 IMPLANT
NEEDLE INSUFFLATION 14GA 120MM (NEEDLE) ×4 IMPLANT
OCCLUDER COLPOPNEUMO (BALLOONS) ×4 IMPLANT
PACK LAVH (CUSTOM PROCEDURE TRAY) ×4 IMPLANT
PAD PREP 24X48 CUFFED NSTRL (MISCELLANEOUS) ×8 IMPLANT
PROTECTOR NERVE ULNAR (MISCELLANEOUS) ×8 IMPLANT
SET CYSTO W/LG BORE CLAMP LF (SET/KITS/TRAYS/PACK) ×4 IMPLANT
SET IRRIG TUBING LAPAROSCOPIC (IRRIGATION / IRRIGATOR) ×8 IMPLANT
SOLUTION ELECTROLUBE (MISCELLANEOUS) ×4 IMPLANT
SUT VIC AB 0 CT1 27 (SUTURE) ×2
SUT VIC AB 0 CT1 27XBRD ANBCTR (SUTURE) ×6 IMPLANT
SUT VICRYL 0 UR6 27IN ABS (SUTURE) ×4 IMPLANT
SUT VICRYL RAPIDE 4/0 PS 2 (SUTURE) ×8 IMPLANT
SUT VLOC 180 0 9IN  GS21 (SUTURE)
SUT VLOC 180 0 9IN GS21 (SUTURE) IMPLANT
SYR 30ML LL (SYRINGE) ×4 IMPLANT
SYR 50ML LL SCALE MARK (SYRINGE) ×4 IMPLANT
SYR TB 1ML 25GX5/8 (SYRINGE) ×8 IMPLANT
TIP UTERINE 6.7X8CM BLUE DISP (MISCELLANEOUS) ×4 IMPLANT
TOWEL OR 17X24 6PK STRL BLUE (TOWEL DISPOSABLE) ×12 IMPLANT
TRAY FOLEY BAG SILVER LF 14FR (CATHETERS) ×4 IMPLANT
TROCAR DISP BLADELESS 8 DVNC (TROCAR) ×3 IMPLANT
TROCAR DISP BLADELESS 8MM (TROCAR) ×1
TROCAR XCEL NON-BLD 5MMX100MML (ENDOMECHANICALS) ×4 IMPLANT
TROCAR Z THRD FIOS 12X150 (TROCAR) ×4 IMPLANT
TUBING FILTER THERMOFLATOR (ELECTROSURGICAL) ×4 IMPLANT
WARMER LAPAROSCOPE (MISCELLANEOUS) ×4 IMPLANT
WATER STERILE IRR 1000ML POUR (IV SOLUTION) ×12 IMPLANT

## 2012-07-28 NOTE — Anesthesia Preprocedure Evaluation (Signed)
Anesthesia Evaluation  Patient identified by MRN, date of birth, ID band Patient awake    Reviewed: Allergy & Precautions, H&P , NPO status , Patient's Chart, lab work & pertinent test results, reviewed documented beta blocker date and time   History of Anesthesia Complications Negative for: history of anesthetic complications  Airway Mallampati: II TM Distance: >3 FB Neck ROM: full    Dental  (+) Teeth Intact   Pulmonary neg pulmonary ROS,  breath sounds clear to auscultation  Pulmonary exam normal       Cardiovascular Exercise Tolerance: Good negative cardio ROS  Rhythm:regular Rate:Normal     Neuro/Psych negative neurological ROS  negative psych ROS   GI/Hepatic negative GI ROS, Neg liver ROS,   Endo/Other  Hypothyroidism   Renal/GU negative Renal ROS  Female GU complaint     Musculoskeletal   Abdominal   Peds  Hematology negative hematology ROS (+)   Anesthesia Other Findings   Reproductive/Obstetrics negative OB ROS                           Anesthesia Physical Anesthesia Plan  ASA: II  Anesthesia Plan: General ETT   Post-op Pain Management:    Induction:   Airway Management Planned:   Additional Equipment:   Intra-op Plan:   Post-operative Plan:   Informed Consent: I have reviewed the patients History and Physical, chart, labs and discussed the procedure including the risks, benefits and alternatives for the proposed anesthesia with the patient or authorized representative who has indicated his/her understanding and acceptance.   Dental Advisory Given  Plan Discussed with: CRNA and Surgeon  Anesthesia Plan Comments:         Anesthesia Quick Evaluation

## 2012-07-28 NOTE — Transfer of Care (Signed)
Immediate Anesthesia Transfer of Care Note  Patient: Lindsay Avila  Procedure(s) Performed: Procedure(s) (LRB) with comments: ROBOTIC ASSISTED TOTAL HYSTERECTOMY (N/A) BILATERAL SALPINGECTOMY (Bilateral) CYSTOSCOPY ()  Patient Location: PACU  Anesthesia Type: General  Level of Consciousness: sedated  Airway & Oxygen Therapy: Patient Spontanous Breathing and Patient connected to nasal cannula oxygen  Post-op Assessment: Report given to PACU RN  Post vital signs: Reviewed  Complications: No apparent anesthesia complications

## 2012-07-28 NOTE — Preoperative (Signed)
Beta Blockers   Reason not to administer Beta Blockers:Not Applicable 

## 2012-07-28 NOTE — Anesthesia Postprocedure Evaluation (Signed)
  Anesthesia Post-op Note  Patient: Lindsay Avila  Procedure(s) Performed: Procedure(s) (LRB) with comments: ROBOTIC ASSISTED TOTAL HYSTERECTOMY (N/A) BILATERAL SALPINGECTOMY (Bilateral) CYSTOSCOPY ()  Patient Location: PACU  Anesthesia Type: General  Level of Consciousness: awake, alert  and oriented  Airway and Oxygen Therapy: Patient Spontanous Breathing  Post-op Pain: mild  Post-op Assessment: Post-op Vital signs reviewed, Patient's Cardiovascular Status Stable, Respiratory Function Stable, Patent Airway, No signs of Nausea or vomiting and Pain level controlled  Post-op Vital Signs: Reviewed and stable  Complications: No apparent anesthesia complications

## 2012-07-28 NOTE — Anesthesia Postprocedure Evaluation (Signed)
  Anesthesia Post-op Note  Patient: Lindsay Avila  Procedure(s) Performed: Procedure(s) (LRB) with comments: ROBOTIC ASSISTED TOTAL HYSTERECTOMY (N/A) BILATERAL SALPINGECTOMY (Bilateral) CYSTOSCOPY ()  Patient Location: [facility]  Anesthesia Type: General  Level of Consciousness: awake  Airway and Oxygen Therapy: Patient Spontanous Breathing  Post-op Pain: mild  Post-op Assessment: Patient's Cardiovascular Status Stable and Respiratory Function Stable  Post-op Vital Signs: stable  Complications: No apparent anesthesia complications

## 2012-07-28 NOTE — Progress Notes (Signed)
Patient discharged home. Verbalized understanding of discharge instructions.

## 2012-07-28 NOTE — Op Note (Signed)
Preoperative diagnosis: Menorrhagia large uterine fibroid Postoperative diagnosis: Same, path pending, and endometriosis Procedure: Robotic assisted total laparoscopic hysterectomy with bilateral salpingectomy, cystoscopy Surgeon: Dr. Meredeth Ide Assistants: Dr. Leda Quail and Dr. Douglass Rivers Anesthesia: Gen. endotracheal Estimated blood loss: 50 cc Complications: None Procedure: The patient was taken to the operating room and was given IV sedation.  She was placed in the low dorsolithotomy position.  After induction of general endotracheal anesthesia she was prepped and draped in usual fashion.  A posterior weighted and anterior Deaver retractor were placed the cervix was grasped on its anterior lip with a single-tooth tenaculum.  The vaginal cuff was infiltrated with a total of 10 cc of quarter percent ropivacaine.  The cervix was dilated to #21 Shawnie Pons.  The uterus sounded to 8 cm.  An 8 cm Rumi manipulator with a small Koh ring was placed without difficulty.  A Foley catheter was inserted.  While surgeon was inserted and the vaginal instruments the assistant had made a small nick in the umbilicus and inserted the varies needle into the peritoneal space.  Proper placement was noted with the hanging drop technique.  Pneumoperitoneum was then created with the automatic insufflator to abdominal pressure of 15 mm of mercury.  Transverse incision was made approximately 5 cm above the umbilicus after infiltrating with dilute ropivacaine and a 12 mm bladed trocar was then inserted and its proper position noted the laparoscope.  The placement for the 2 robotic ports and the assistant's port was planned, the skin was infiltrated with dilute ropivacaine.  The 2 8mm robotic ports were placed under direct visualization.  As were approximately 10 cm to the right and the left and just below the camera port.  The assistance port was placed in the right lower cautery and.  It was a 12 mm blade was trocar and was  also inserted under direct visualization.  Abdomen and pelvis were inspected.  There was a solitary large fundal uterine fibroid which had measured approximately 9 cm on preoperative ultrasound.  The uterus was mobile.  The right tube and ovary were normal.  The left tube was normal, however the left tube was somewhat adherent to the posterior leaf of the broad ligament and there was an area of endometriosis attaching the uterus to the broad ligament at that point.  The ovary also had some small areas of powder burn endometriosis on it on the left.  The ureters were identified peristalsing bilaterally.  The upper abdomen was inspected and.  The gallbladder was quite distended, and there was a small area of white discoloration to the liver just above the gallbladder.  Otherwise the upper abdomen was normal.  She began on the patient's right.  The utero-ovarian ligament was identified cauterized and cut.  The tube was separated from the ovary along the mesosalpinx all the way to the cornu using cautery and then cutting.  The round ligament was cauterized and cut.  The anterior posterior leafs of the broad ligament on the right were taken down sharply.  The bladder was dissected off the [facility] ring sharply.  The uterine artery on the right was skeletonized and multiply cold coagulated.  It was not cut at this point.  Attention turned next to the patient's left.  The utero-ovarian ligament was identified and coagulated, and then cut.  Ovary did not free up off the broad ligament well this point because of the endometriosis.  The round ligament on the left was cauterized and cut.  The  tube was separated from the ovary along the mesosalpinx all the way to the cornu using cautery and then cutting.  The anterior leaf of the broad ligament was taken down sharply.  The uterine artery was skeletonized.  I could then see the posterior leaf of the broad ligament and it was taken down sharply and this did free up the ovary so that  could be removed I the operative field.  After skeletonizing the uterine vessels on the left they were coagulated multiple times and then cut.  Attention was next turned back to the vessels on the patient's right there were coagulated again, and cut.  A circumferential colpotomy incision was then made with monopolar cautery.  Robotic instruments were then removed and the robot was undocked.  An 8 mm camera was put in through the robotic port on the right the morcellator was put through the umbilical port and a 10 mm tenaculum was placed and the assistant's port.  Morcellation was done until the uterus was of a size that it was felt that it was possible for it to be removed vaginally.  The body of the uterus was pulled down into the vagina however the fibroid was still precluding its total removal therefore vaginal morcellation was done at this point until the specimen was removed in its entirety.  The surgeon then proceeded back to the consult.  The robot was redox.  The mega-suture cut needle driver and a Cobra grasper were placed in under direct visualization.  A V. LOC suture was dropped into the assistant's port and was used to close the close the vaginal cuff in running fashion.  A completing the closure of the vagina and the needle was brought back out through the assistant's port.  Sheet of Interceed was brought in and placed over the vaginal cuff.  When attempting to see the ureters at this point with elevation of the ovary the ovary had a small amount of bleeding and was therefore cauterized with PK gyrus successfully.  The patient's right ureter was clearly seen peristalsing the left ureter could not be seen.  It was concealed with bowel that was adherent to the sidewall at the pelvic brim.  The instruments were then removed under direct visualization the robot was undocked inspection of the upper abdomen was then again to ensure there was no evidence of any small pieces of uterus that had been left behind  after morcellation.  There were none.  Trocar sleeves were then removed under direct visualization.  The peritoneum was allowed to escape through the umbilical port for was removed.  Manual deep breaths were given by anesthesia to assist in removing the CO2 from the abdomen.  The assistance port and the umbilical port had the fascia closed with a single suture of 0 Vicryl.  The skin was then closed subcuticularly with 4-0 Vicryl Rapide, and Steri-Strips.  A Foley catheter was removed and the cystoscope was inserted into the bladder.  The bladder was filled with 200 cc of sterile water.  The ureters were clearly seen with good E. flocks of urine from each one.  The bladder dome was inspected and was normal.  The cystoscope was removed and the bladder was drained.  The vagina was then inspected and was atraumatic and hemostatic.  The suture was terminated.  Sponge and Ledezma counts were correct x3.  The patient was taken to the recovery room in satisfactory condition.

## 2012-07-28 NOTE — Addendum Note (Signed)
Addendum  created 07/28/12 1644 by Renford Dills, CRNA   Modules edited:Notes Section

## 2012-07-28 NOTE — Interval H&P Note (Signed)
History and Physical Interval Note:  07/28/2012 7:13 AM  Lindsay Avila  has presented today for surgery, with the diagnosis of Fibroids  The various methods of treatment have been discussed with the patient and family. After consideration of risks, benefits and other options for treatment, the patient has consented to  Procedure(s) (LRB) with comments: ROBOTIC ASSISTED TOTAL HYSTERECTOMY (N/A) as a surgical intervention .  The patient's history has been reviewed, patient examined, no change in status, stable for surgery.  I have reviewed the patient's chart and labs.  Questions were answered to the patient's satisfaction.     ROMINE,CYNTHIA P

## 2012-07-29 ENCOUNTER — Encounter (HOSPITAL_COMMUNITY): Payer: Self-pay | Admitting: Obstetrics and Gynecology

## 2013-04-07 ENCOUNTER — Other Ambulatory Visit (HOSPITAL_COMMUNITY): Payer: Self-pay | Admitting: Nurse Practitioner

## 2013-04-07 ENCOUNTER — Other Ambulatory Visit: Payer: Self-pay | Admitting: Nurse Practitioner

## 2013-04-07 DIAGNOSIS — Z1231 Encounter for screening mammogram for malignant neoplasm of breast: Secondary | ICD-10-CM

## 2013-04-14 ENCOUNTER — Ambulatory Visit (HOSPITAL_COMMUNITY)
Admission: RE | Admit: 2013-04-14 | Discharge: 2013-04-14 | Disposition: A | Discharge status: Home / Self Care | Payer: BC Managed Care – PPO | Source: Ambulatory Visit | Attending: Nurse Practitioner | Admitting: Nurse Practitioner

## 2013-04-14 DIAGNOSIS — Z1231 Encounter for screening mammogram for malignant neoplasm of breast: Secondary | ICD-10-CM | POA: Insufficient documentation

## 2013-04-21 ENCOUNTER — Ambulatory Visit: Payer: BC Managed Care – PPO

## 2014-03-15 ENCOUNTER — Other Ambulatory Visit (HOSPITAL_COMMUNITY): Payer: Self-pay | Admitting: Nurse Practitioner

## 2014-03-15 DIAGNOSIS — Z1231 Encounter for screening mammogram for malignant neoplasm of breast: Secondary | ICD-10-CM

## 2014-03-17 ENCOUNTER — Ambulatory Visit (HOSPITAL_COMMUNITY): Payer: BC Managed Care – PPO

## 2014-04-15 ENCOUNTER — Ambulatory Visit (HOSPITAL_COMMUNITY)
Admission: RE | Admit: 2014-04-15 | Discharge: 2014-04-15 | Disposition: A | Discharge status: Home / Self Care | Payer: BC Managed Care – PPO | Source: Ambulatory Visit | Attending: Nurse Practitioner | Admitting: Nurse Practitioner

## 2014-04-15 DIAGNOSIS — Z1231 Encounter for screening mammogram for malignant neoplasm of breast: Secondary | ICD-10-CM | POA: Insufficient documentation

## 2015-03-01 ENCOUNTER — Other Ambulatory Visit (HOSPITAL_COMMUNITY): Payer: Self-pay | Admitting: Nurse Practitioner

## 2015-03-03 ENCOUNTER — Other Ambulatory Visit (HOSPITAL_COMMUNITY): Payer: Self-pay | Admitting: Nurse Practitioner

## 2015-03-03 DIAGNOSIS — Z1231 Encounter for screening mammogram for malignant neoplasm of breast: Secondary | ICD-10-CM

## 2015-03-17 ENCOUNTER — Ambulatory Visit (INDEPENDENT_AMBULATORY_CARE_PROVIDER_SITE_OTHER): Payer: BC Managed Care – PPO | Admitting: Nurse Practitioner

## 2015-03-17 ENCOUNTER — Encounter: Payer: Self-pay | Admitting: Nurse Practitioner

## 2015-03-17 VITALS — BP 118/80 | HR 68 | Ht 65.0 in | Wt 152.5 lb

## 2015-03-17 DIAGNOSIS — Z01419 Encounter for gynecological examination (general) (routine) without abnormal findings: Secondary | ICD-10-CM

## 2015-03-17 DIAGNOSIS — Z0000 Encounter for general adult medical examination without abnormal findings: Secondary | ICD-10-CM | POA: Diagnosis not present

## 2015-03-17 LAB — POCT URINALYSIS DIPSTICK
Bilirubin, UA: NEGATIVE
Blood, UA: NEGATIVE
GLUCOSE UA: NEGATIVE
Ketones, UA: NEGATIVE
LEUKOCYTES UA: NEGATIVE
Nitrite, UA: NEGATIVE
Protein, UA: NEGATIVE
Urobilinogen, UA: NEGATIVE
pH, UA: 6

## 2015-03-17 NOTE — Patient Instructions (Signed)

## 2015-03-17 NOTE — Progress Notes (Signed)
Patient ID: Lindsay Avila, female   DOB: [birth year], 44 y.o.   MRN: [id number] 44 y.o.  Single  African American Fe here for annual exam.  Not seen here since her surgery with Robotic Hysterectomy and salpingectomy due to large fibroids in  22013.  Same partner for 7 years.  Patient's last menstrual period was 06/26/2012.          Sexually active: Yes.   female The current method of family planning is Hysterectomy   Exercising: No.  cardio Smoker:  no  Health Maintenance: Pap:  02/2011 Negative MMG: 04-15-2014  BI RADS Negative TDaP:  10/2010 Labs:PCP does labs  UA negative   reports that she has never smoked. She has never used smokeless tobacco. She reports that she drinks alcohol. She reports that she does not use illicit drugs.  Past Medical History  Diagnosis Date  . Genital herpes 2013  . Hypothyroidism age 44's    Past Surgical History  Procedure Laterality Date  . Cystoscopy  07/28/2012    Procedure: CYSTOSCOPY;  Surgeon: Peri Maris, MD;  Location: [facility];  Service: Gynecology;;  . Abdominal hysterectomy  07/28/12    Robotic    Current Outpatient Prescriptions  Medication Sig Dispense Refill  . levothyroxine (SYNTHROID, LEVOTHROID) 125 MCG tablet Take 125 mcg by mouth daily.       No current facility-administered medications for this visit.    Family History  Problem Relation Age of Onset  . Hypertension Mother   . Arthritis Mother   . Diabetes Father     ROS:  Pertinent items are noted in HPI.  Otherwise, a comprehensive ROS was negative.  Exam:   BP 118/80 mmHg  Pulse 68  Ht 5\' 5"  (1.651 m)  Wt 152 lb 8 oz (69.174 kg)  BMI 25.38 kg/m2  LMP 06/26/2012 Height: 5\' 5"  (165.1 cm) Ht Readings from Last 3 Encounters:  03/17/15 5\' 5"  (1.651 m)  02/02/10 5\' 5"  (1.651 m)    General appearance: alert, cooperative and appears stated age Head: Normocephalic, without obvious abnormality, atraumatic Neck: no adenopathy, supple, symmetrical, trachea midline and  thyroid normal to inspection and palpation Lungs: clear to auscultation bilaterally Breasts: normal appearance, no masses or tenderness Heart: regular rate and rhythm Abdomen: soft, non-tender; no masses,  no organomegaly Extremities: extremities normal, atraumatic, no cyanosis or edema Skin: Skin color, texture, turgor normal. No rashes or lesions Lymph nodes: Cervical, supraclavicular, and axillary nodes normal. No abnormal inguinal nodes palpated Neurologic: Grossly normal   Pelvic: External genitalia:  no lesions              Urethra:  normal appearing urethra with no masses, tenderness or lesions              Bartholin's and Skene's: normal                 Vagina: normal appearing vagina with normal color and discharge, no lesions              Cervix: absent              Pap taken: Yes.   Bimanual Exam:  Uterus:  uterus absent              Adnexa: no mass, fullness, tenderness               Rectovaginal: Confirms               Anus:  normal sphincter tone, no  lesions  Chaperone present: yes  A:  Well Woman with normal exam  S/P Robotic Hysterectomy with salpingectomy for large fibroid uterus 07/28/12  History of HSV - genital  Hypothyroid - PCP manages  P:   Reviewed health and wellness pertinent to exam  Pap smear as above  Mammogram is due 7/16  Counseled on breast self exam, mammography screening, adequate intake of calcium and vitamin D, diet and exercise return annually or prn  An After Visit Summary was printed and given to the patient.

## 2015-03-20 NOTE — Progress Notes (Signed)
Encounter reviewed by Dr. Brook Silva.  

## 2015-03-21 LAB — IPS PAP TEST WITH HPV

## 2015-05-03 ENCOUNTER — Ambulatory Visit (HOSPITAL_COMMUNITY)
Admission: RE | Admit: 2015-05-03 | Discharge: 2015-05-03 | Disposition: A | Discharge status: Home / Self Care | Payer: BC Managed Care – PPO | Source: Ambulatory Visit | Attending: Nurse Practitioner | Admitting: Nurse Practitioner

## 2015-05-03 DIAGNOSIS — Z1231 Encounter for screening mammogram for malignant neoplasm of breast: Secondary | ICD-10-CM | POA: Diagnosis not present

## 2016-04-16 ENCOUNTER — Other Ambulatory Visit (HOSPITAL_BASED_OUTPATIENT_CLINIC_OR_DEPARTMENT_OTHER): Payer: Self-pay | Admitting: Nurse Practitioner

## 2016-04-16 DIAGNOSIS — Z1231 Encounter for screening mammogram for malignant neoplasm of breast: Secondary | ICD-10-CM

## 2016-05-03 ENCOUNTER — Ambulatory Visit (HOSPITAL_BASED_OUTPATIENT_CLINIC_OR_DEPARTMENT_OTHER)
Admission: RE | Admit: 2016-05-03 | Discharge: 2016-05-03 | Disposition: A | Discharge status: Home / Self Care | Payer: BC Managed Care – PPO | Source: Ambulatory Visit | Attending: Nurse Practitioner | Admitting: Nurse Practitioner

## 2016-05-03 DIAGNOSIS — Z1231 Encounter for screening mammogram for malignant neoplasm of breast: Secondary | ICD-10-CM | POA: Diagnosis not present

## 2017-02-05 ENCOUNTER — Other Ambulatory Visit: Payer: Self-pay | Admitting: Internal Medicine

## 2017-02-05 DIAGNOSIS — E042 Nontoxic multinodular goiter: Secondary | ICD-10-CM

## 2017-02-18 ENCOUNTER — Other Ambulatory Visit: Payer: BC Managed Care – PPO

## 2017-03-05 ENCOUNTER — Ambulatory Visit
Admission: RE | Admit: 2017-03-05 | Discharge: 2017-03-05 | Disposition: A | Discharge status: Home / Self Care | Payer: BC Managed Care – PPO | Source: Ambulatory Visit | Attending: Internal Medicine | Admitting: Internal Medicine

## 2017-03-05 DIAGNOSIS — E042 Nontoxic multinodular goiter: Secondary | ICD-10-CM

## 2017-04-12 ENCOUNTER — Other Ambulatory Visit (HOSPITAL_BASED_OUTPATIENT_CLINIC_OR_DEPARTMENT_OTHER): Payer: Self-pay | Admitting: Nurse Practitioner

## 2017-04-12 DIAGNOSIS — Z1239 Encounter for other screening for malignant neoplasm of breast: Secondary | ICD-10-CM

## 2017-05-07 ENCOUNTER — Ambulatory Visit (HOSPITAL_BASED_OUTPATIENT_CLINIC_OR_DEPARTMENT_OTHER): Payer: BC Managed Care – PPO

## 2017-05-07 ENCOUNTER — Ambulatory Visit (HOSPITAL_BASED_OUTPATIENT_CLINIC_OR_DEPARTMENT_OTHER)
Admission: RE | Admit: 2017-05-07 | Discharge: 2017-05-07 | Disposition: A | Discharge status: Home / Self Care | Payer: BC Managed Care – PPO | Source: Ambulatory Visit | Attending: Nurse Practitioner | Admitting: Nurse Practitioner

## 2017-05-07 DIAGNOSIS — Z1239 Encounter for other screening for malignant neoplasm of breast: Secondary | ICD-10-CM

## 2017-05-07 DIAGNOSIS — Z1231 Encounter for screening mammogram for malignant neoplasm of breast: Secondary | ICD-10-CM | POA: Insufficient documentation

## 2017-06-05 ENCOUNTER — Encounter (HOSPITAL_BASED_OUTPATIENT_CLINIC_OR_DEPARTMENT_OTHER): Payer: Self-pay | Admitting: *Deleted

## 2017-06-05 ENCOUNTER — Emergency Department (HOSPITAL_BASED_OUTPATIENT_CLINIC_OR_DEPARTMENT_OTHER)
Admission: EM | Admit: 2017-06-05 | Discharge: 2017-06-05 | Disposition: A | Discharge status: Home / Self Care | Payer: BC Managed Care – PPO | Attending: Emergency Medicine | Admitting: Emergency Medicine

## 2017-06-05 ENCOUNTER — Emergency Department (HOSPITAL_BASED_OUTPATIENT_CLINIC_OR_DEPARTMENT_OTHER): Payer: BC Managed Care – PPO

## 2017-06-05 DIAGNOSIS — E039 Hypothyroidism, unspecified: Secondary | ICD-10-CM | POA: Insufficient documentation

## 2017-06-05 DIAGNOSIS — S60221A Contusion of right hand, initial encounter: Secondary | ICD-10-CM | POA: Diagnosis not present

## 2017-06-05 DIAGNOSIS — S6991XA Unspecified injury of right wrist, hand and finger(s), initial encounter: Secondary | ICD-10-CM | POA: Diagnosis present

## 2017-06-05 DIAGNOSIS — Z79899 Other long term (current) drug therapy: Secondary | ICD-10-CM | POA: Insufficient documentation

## 2017-06-05 DIAGNOSIS — Y939 Activity, unspecified: Secondary | ICD-10-CM | POA: Diagnosis not present

## 2017-06-05 DIAGNOSIS — Y92410 Unspecified street and highway as the place of occurrence of the external cause: Secondary | ICD-10-CM | POA: Diagnosis not present

## 2017-06-05 DIAGNOSIS — Y999 Unspecified external cause status: Secondary | ICD-10-CM | POA: Diagnosis not present

## 2017-06-05 DIAGNOSIS — S8002XA Contusion of left knee, initial encounter: Secondary | ICD-10-CM | POA: Diagnosis not present

## 2017-06-05 DIAGNOSIS — S5012XA Contusion of left forearm, initial encounter: Secondary | ICD-10-CM

## 2017-06-05 MED ORDER — IBUPROFEN 600 MG PO TABS: 600.0000 mg | ORAL_TABLET | Freq: Four times a day (QID) | ORAL | 0 refills | Status: AC | PRN

## 2017-06-05 MED ORDER — HYDROCODONE-ACETAMINOPHEN 5-325 MG PO TABS
1.0000 | ORAL_TABLET | Freq: Once | ORAL | Status: AC
  Administered 2017-06-05: 1 via ORAL
  Filled 2017-06-05: qty 1

## 2017-06-05 MED ORDER — IBUPROFEN 800 MG PO TABS
800.0000 mg | ORAL_TABLET | Freq: Once | ORAL | Status: AC
  Administered 2017-06-05: 800 mg via ORAL

## 2017-06-05 MED ORDER — METHOCARBAMOL 500 MG PO TABS: 1000.0000 mg | ORAL_TABLET | Freq: Three times a day (TID) | ORAL | 0 refills | Status: DC | PRN

## 2017-06-05 MED ORDER — IBUPROFEN 800 MG PO TABS
ORAL_TABLET | ORAL | Status: AC
  Filled 2017-06-05: qty 1

## 2017-06-05 MED ORDER — HYDROCODONE-ACETAMINOPHEN 5-325 MG PO TABS: 1.0000 | ORAL_TABLET | ORAL | 0 refills | Status: DC | PRN
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
## 2017-06-05 NOTE — ED Notes (Signed)
Patient transported to X-ray 

## 2018-02-07 ENCOUNTER — Other Ambulatory Visit: Payer: Self-pay | Admitting: Internal Medicine

## 2018-02-07 DIAGNOSIS — E042 Nontoxic multinodular goiter: Secondary | ICD-10-CM

## 2018-02-19 ENCOUNTER — Ambulatory Visit
Admission: RE | Admit: 2018-02-19 | Discharge: 2018-02-19 | Disposition: A | Discharge status: Home / Self Care | Payer: BC Managed Care – PPO | Source: Ambulatory Visit | Attending: Internal Medicine | Admitting: Internal Medicine

## 2018-02-19 DIAGNOSIS — E042 Nontoxic multinodular goiter: Secondary | ICD-10-CM

## 2018-03-28 ENCOUNTER — Other Ambulatory Visit (HOSPITAL_BASED_OUTPATIENT_CLINIC_OR_DEPARTMENT_OTHER): Payer: Self-pay | Admitting: Nurse Practitioner

## 2018-03-28 DIAGNOSIS — Z1231 Encounter for screening mammogram for malignant neoplasm of breast: Secondary | ICD-10-CM

## 2018-05-08 ENCOUNTER — Ambulatory Visit (HOSPITAL_BASED_OUTPATIENT_CLINIC_OR_DEPARTMENT_OTHER): Payer: BC Managed Care – PPO

## 2018-05-14 ENCOUNTER — Ambulatory Visit (HOSPITAL_BASED_OUTPATIENT_CLINIC_OR_DEPARTMENT_OTHER)
Admission: RE | Admit: 2018-05-14 | Discharge: 2018-05-14 | Disposition: A | Discharge status: Home / Self Care | Payer: BC Managed Care – PPO | Source: Ambulatory Visit | Attending: Nurse Practitioner | Admitting: Nurse Practitioner

## 2018-05-14 DIAGNOSIS — Z1231 Encounter for screening mammogram for malignant neoplasm of breast: Secondary | ICD-10-CM | POA: Insufficient documentation

## 2019-02-10 ENCOUNTER — Other Ambulatory Visit: Payer: Self-pay | Admitting: Internal Medicine

## 2019-02-10 DIAGNOSIS — E042 Nontoxic multinodular goiter: Secondary | ICD-10-CM

## 2019-03-24 ENCOUNTER — Ambulatory Visit
Admission: RE | Admit: 2019-03-24 | Discharge: 2019-03-24 | Disposition: A | Discharge status: Home / Self Care | Payer: BC Managed Care – PPO | Source: Ambulatory Visit | Attending: Internal Medicine | Admitting: Internal Medicine

## 2019-03-24 DIAGNOSIS — E042 Nontoxic multinodular goiter: Secondary | ICD-10-CM

## 2019-04-14 ENCOUNTER — Other Ambulatory Visit (HOSPITAL_BASED_OUTPATIENT_CLINIC_OR_DEPARTMENT_OTHER): Payer: Self-pay | Admitting: Nurse Practitioner

## 2019-04-14 DIAGNOSIS — Z1231 Encounter for screening mammogram for malignant neoplasm of breast: Secondary | ICD-10-CM

## 2019-05-04 ENCOUNTER — Emergency Department (HOSPITAL_BASED_OUTPATIENT_CLINIC_OR_DEPARTMENT_OTHER): Payer: BC Managed Care – PPO

## 2019-05-04 ENCOUNTER — Other Ambulatory Visit: Payer: Self-pay

## 2019-05-04 ENCOUNTER — Emergency Department (HOSPITAL_BASED_OUTPATIENT_CLINIC_OR_DEPARTMENT_OTHER)
Admission: EM | Admit: 2019-05-04 | Discharge: 2019-05-04 | Disposition: A | Discharge status: Home / Self Care | Payer: BC Managed Care – PPO | Attending: Emergency Medicine | Admitting: Emergency Medicine

## 2019-05-04 ENCOUNTER — Encounter (HOSPITAL_BASED_OUTPATIENT_CLINIC_OR_DEPARTMENT_OTHER): Payer: Self-pay | Admitting: *Deleted

## 2019-05-04 DIAGNOSIS — E039 Hypothyroidism, unspecified: Secondary | ICD-10-CM | POA: Diagnosis not present

## 2019-05-04 DIAGNOSIS — R0789 Other chest pain: Secondary | ICD-10-CM | POA: Diagnosis not present

## 2019-05-04 DIAGNOSIS — R0602 Shortness of breath: Secondary | ICD-10-CM | POA: Insufficient documentation

## 2019-05-04 DIAGNOSIS — R079 Chest pain, unspecified: Secondary | ICD-10-CM | POA: Diagnosis present

## 2019-05-04 LAB — CBC WITH DIFFERENTIAL/PLATELET
Abs Immature Granulocytes: 0.03 10*3/uL (ref 0.00–0.07)
Basophils Absolute: 0 10*3/uL (ref 0.0–0.1)
Basophils Relative: 0 %
Eosinophils Absolute: 0.1 10*3/uL (ref 0.0–0.5)
Eosinophils Relative: 2 %
HCT: 37.8 % (ref 36.0–46.0)
Hemoglobin: 12.7 g/dL (ref 12.0–15.0)
Immature Granulocytes: 1 %
Lymphocytes Relative: 40 %
Lymphs Abs: 1.8 10*3/uL (ref 0.7–4.0)
MCH: 30 pg (ref 26.0–34.0)
MCHC: 33.6 g/dL (ref 30.0–36.0)
MCV: 89.4 fL (ref 80.0–100.0)
Monocytes Absolute: 0.4 10*3/uL (ref 0.1–1.0)
Monocytes Relative: 9 %
Neutro Abs: 2.2 10*3/uL (ref 1.7–7.7)
Neutrophils Relative %: 48 %
Platelets: 280 10*3/uL (ref 150–400)
RBC: 4.23 MIL/uL (ref 3.87–5.11)
RDW: 12.1 % (ref 11.5–15.5)
WBC: 4.5 10*3/uL (ref 4.0–10.5)
nRBC: 0 % (ref 0.0–0.2)

## 2019-05-04 LAB — COMPREHENSIVE METABOLIC PANEL
ALT: 16 U/L (ref 0–44)
AST: 18 U/L (ref 15–41)
Albumin: 4.2 g/dL (ref 3.5–5.0)
Alkaline Phosphatase: 61 U/L (ref 38–126)
Anion gap: 9 (ref 5–15)
BUN: 11 mg/dL (ref 6–20)
CO2: 27 mmol/L (ref 22–32)
Calcium: 9.2 mg/dL (ref 8.9–10.3)
Chloride: 103 mmol/L (ref 98–111)
Creatinine, Ser: 0.8 mg/dL (ref 0.44–1.00)
GFR calc Af Amer: >60 mL/min (ref >60)
GFR calc non Af Amer: >60 mL/min (ref >60)
Glucose, Bld: 116 mg/dL — ABNORMAL HIGH (ref 70–99)
Potassium: 3.6 mmol/L (ref 3.5–5.1)
Sodium: 139 mmol/L (ref 135–145)
Total Bilirubin: 0.4 mg/dL (ref 0.3–1.2)
Total Protein: 7.7 g/dL (ref 6.5–8.1)

## 2019-05-04 LAB — D-DIMER, QUANTITATIVE: D-Dimer, Quant: 0.46 ug/mL-FEU (ref 0.00–0.50)

## 2019-05-04 LAB — TROPONIN I (HIGH SENSITIVITY): Troponin I (High Sensitivity): 2 ng/L (ref <18)

## 2019-05-04 MED ORDER — ALBUTEROL SULFATE HFA 108 (90 BASE) MCG/ACT IN AERS
2.0000 | INHALATION_SPRAY | Freq: Once | RESPIRATORY_TRACT | Status: AC
  Administered 2019-05-04: 2 via RESPIRATORY_TRACT
  Filled 2019-05-04: qty 6.7

## 2019-05-04 NOTE — Progress Notes (Signed)
RN gave albuterol MDI treatment to patient.

## 2019-05-04 NOTE — ED Triage Notes (Addendum)
c/o SOB and CP with exertion x 2 days, positive for covid x 2 weeks ago

## 2019-05-04 NOTE — ED Provider Notes (Signed)
[facility] EMERGENCY DEPARTMENT Provider Note   CSN: [id number] Arrival date & time: 05/04/19  1716    History   Chief Complaint Chief Complaint  Patient presents with   Chest Pain    HPI Lindsay Avila is a 48 y.o. female.     Patient is a 48 year old female who presents with chest pain.  She had a positive COVID infection about 2 weeks ago.  At that time she was feeling fatigued and achy.  She has not had a significant cough.  Over the last 2 days she has had a tightness to the center of her chest.  She says it feels worse when she takes a deep breath.  She has a little bit of shortness of breath, particularly on exertion.  There is no radiation to the pain.  No nausea or vomiting.  No diaphoresis.  She has a little bit of a dry cough but nothing significant.  No known fevers.  No leg pain or swelling.  No history of similar symptoms in the past.     Past Medical History:  Diagnosis Date   Genital herpes 2013   Hypothyroidism age 9's    Patient Active Problem List   Diagnosis Date Noted   HEMATURIA 02/20/2010   HYPOTHYROIDISM 02/02/2010   URI 02/02/2010   HERPES GENITALIS 11/24/2008    Past Surgical History:  Procedure Laterality Date   ABDOMINAL HYSTERECTOMY  07/28/12   Robotic   CYSTOSCOPY  07/28/2012   Procedure: CYSTOSCOPY;  Surgeon: Peri Maris, MD;  Location: [facility];  Service: Gynecology;;     OB History    Gravida  0   Para      Term      Preterm      AB      Living        SAB      TAB      Ectopic      Multiple      Live Births               Home Medications    Prior to Admission medications   Medication Sig Start Date End Date Taking? Authorizing Provider  HYDROcodone-acetaminophen (NORCO) 5-325 MG tablet Take 1 tablet by mouth every 4 (four) hours as needed for severe pain. 06/05/17   Julianne Rice, MD  ibuprofen (ADVIL,MOTRIN) 600 MG tablet Take 1 tablet (600 mg total) by mouth every 6 (six)  hours as needed. 06/05/17   Julianne Rice, MD  levothyroxine (SYNTHROID, LEVOTHROID) 125 MCG tablet Take 125 mcg by mouth daily.      [provider]  methocarbamol (ROBAXIN) 500 MG tablet Take 2 tablets (1,000 mg total) by mouth every 8 (eight) hours as needed for muscle spasms. 06/05/17   Julianne Rice, MD    Family History Family History  Problem Relation Age of Onset   Hypertension Mother    Arthritis Mother    Diabetes Father     Social History Social History   Tobacco Use   Smoking status: Never Smoker   Smokeless tobacco: Never Used  Substance Use Topics   Alcohol use: Yes    Comment: occasional   Drug use: No     Allergies   Penicillins   Review of Systems Review of Systems  Constitutional: Negative for chills, diaphoresis, fatigue and fever.  HENT: Negative for congestion, rhinorrhea and sneezing.   Eyes: Negative.   Respiratory: Positive for shortness of breath. Negative for cough and chest  tightness.   Cardiovascular: Positive for chest pain. Negative for leg swelling.  Gastrointestinal: Negative for abdominal pain, blood in stool, diarrhea, nausea and vomiting.  Genitourinary: Negative for difficulty urinating, flank pain, frequency and hematuria.  Musculoskeletal: Negative for arthralgias and back pain.  Skin: Negative for rash.  Neurological: Negative for dizziness, speech difficulty, weakness, numbness and headaches.     Physical Exam Updated Vital Signs BP 132/89    Pulse 76    Temp 98.4 F (36.9 C) (Oral)    Resp (!) 21    Ht 5\' 5"  (1.651 m)    Wt 67.1 kg    LMP 06/26/2012    SpO2 99%    BMI 24.63 kg/m   Physical Exam Constitutional:      Appearance: She is well-developed.  HENT:     Head: Normocephalic and atraumatic.  Eyes:     Pupils: Pupils are equal, round, and reactive to light.  Neck:     Musculoskeletal: Normal range of motion and neck supple.  Cardiovascular:     Rate and Rhythm: Normal rate and regular rhythm.      Heart sounds: Normal heart sounds.  Pulmonary:     Effort: Pulmonary effort is normal. No respiratory distress.     Breath sounds: Normal breath sounds. No wheezing or rales.  Chest:     Chest wall: No tenderness.  Abdominal:     General: Bowel sounds are normal.     Palpations: Abdomen is soft.     Tenderness: There is no abdominal tenderness. There is no guarding or rebound.  Musculoskeletal: Normal range of motion.     Comments: No edema or calf tenderness  Lymphadenopathy:     Cervical: No cervical adenopathy.  Skin:    General: Skin is warm and dry.     Findings: No rash.  Neurological:     Mental Status: She is alert and oriented to person, place, and time.      ED Treatments / Results  Labs (all labs ordered are listed, but only abnormal results are displayed) Labs Reviewed  COMPREHENSIVE METABOLIC PANEL - Abnormal; Notable for the following components:      Result Value   Glucose, Bld 116 (*)    All other components within normal limits  CBC WITH DIFFERENTIAL/PLATELET  D-DIMER, QUANTITATIVE (NOT AT [facility])  TROPONIN I (HIGH SENSITIVITY)    EKG EKG Interpretation  Date/Time:  Monday May 04 2019 17:32:30 EDT Ventricular Rate:  84 PR Interval:    QRS Duration: 91 QT Interval:  374 QTC Calculation: 443 R Axis:   104 Text Interpretation:  Sinus rhythm Right axis deviation No old tracing to compare Confirmed by Malvin Johns [phone]) on 05/04/2019 7:53:33 PM   Radiology Dg Chest Portable 1 View  Result Date: 05/04/2019 CLINICAL DATA:  Chest pain and shortness of breath. COVID-19 positive 2 weeks ago. EXAM: PORTABLE CHEST 1 VIEW COMPARISON:  None. FINDINGS: The cardiomediastinal contours are normal. The lungs are clear. Pulmonary vasculature is normal. No consolidation, pleural effusion, or pneumothorax. No acute osseous abnormalities are seen. IMPRESSION: No acute chest findings. Electronically Signed   By: Keith Rake M.D.   On: 05/04/2019 20:17     Procedures Procedures (including critical care time)  Medications Ordered in ED Medications  albuterol (VENTOLIN HFA) 108 (90 Base) MCG/ACT inhaler 2 puff (2 puffs Inhalation Given 05/04/19 2204)     Initial Impression / Assessment and Plan / ED Course  I have reviewed the triage vital signs and the  nursing notes.  Pertinent labs & imaging results that were available during my care of the patient were reviewed by me and considered in my medical decision making (see chart for details).        Patient is a 48 year old female who presents with chest pain.  She has some mild associated shortness of breath.  She has not exertional symptoms.  Her EKG does not show any ischemic changes.  Her troponin is negative.  I did not repeat the troponin as the patient's pain is been constant for 2 days.  I have a low suspicion that this is cardiac in origin.  She has had a recent COVID infection so I checked a d-dimer to assess for possible PE.  This was negative.  She has no hypoxia or increased work of breathing.  Chest x-ray is clear without evidence of pneumonia or pneumothorax.  She was given an albuterol inhaler as she had some minimal wheezing on exam and this seemed to improve her symptoms.  Her chest tightness eased off and her shortness of breath improved.  I suspect she might have some reactive disease related to her recent viral infection.  She was encouraged to use inhaler at home as needed.  She was encouraged to follow-up with her PCP.  Return precautions were given.  Final Clinical Impressions(s) / ED Diagnoses   Final diagnoses:  Atypical chest pain    ED Discharge Orders    None       Malvin Johns, MD 05/04/19 2237

## 2019-05-04 NOTE — ED Notes (Signed)
Patient given ginger ale per request for PO challenge.

## 2019-05-04 NOTE — Discharge Instructions (Addendum)
Use the inhaler as directed.  Follow-up with your primary care doctor to have a recheck on your symptoms.  Return here as needed for any worsening symptoms.

## 2019-05-15 ENCOUNTER — Other Ambulatory Visit: Payer: Self-pay

## 2019-05-15 ENCOUNTER — Ambulatory Visit (HOSPITAL_BASED_OUTPATIENT_CLINIC_OR_DEPARTMENT_OTHER)
Admission: RE | Admit: 2019-05-15 | Discharge: 2019-05-15 | Disposition: A | Discharge status: Home / Self Care | Payer: BC Managed Care – PPO | Source: Ambulatory Visit | Attending: Nurse Practitioner | Admitting: Nurse Practitioner

## 2019-05-15 DIAGNOSIS — Z1231 Encounter for screening mammogram for malignant neoplasm of breast: Secondary | ICD-10-CM | POA: Insufficient documentation

## 2019-05-18 ENCOUNTER — Other Ambulatory Visit: Payer: Self-pay | Admitting: Nurse Practitioner

## 2019-05-18 DIAGNOSIS — R928 Other abnormal and inconclusive findings on diagnostic imaging of breast: Secondary | ICD-10-CM

## 2019-05-20 ENCOUNTER — Ambulatory Visit
Admission: RE | Admit: 2019-05-20 | Discharge: 2019-05-20 | Disposition: A | Discharge status: Home / Self Care | Payer: BC Managed Care – PPO | Source: Ambulatory Visit | Attending: Nurse Practitioner | Admitting: Nurse Practitioner

## 2019-05-20 ENCOUNTER — Other Ambulatory Visit: Payer: Self-pay

## 2019-05-20 DIAGNOSIS — R928 Other abnormal and inconclusive findings on diagnostic imaging of breast: Secondary | ICD-10-CM

## 2020-02-08 ENCOUNTER — Other Ambulatory Visit: Payer: Self-pay | Admitting: Internal Medicine

## 2020-02-08 DIAGNOSIS — E042 Nontoxic multinodular goiter: Secondary | ICD-10-CM

## 2020-04-14 ENCOUNTER — Ambulatory Visit
Admission: RE | Admit: 2020-04-14 | Discharge: 2020-04-14 | Disposition: A | Discharge status: Home / Self Care | Payer: BC Managed Care – PPO | Source: Ambulatory Visit | Attending: Internal Medicine | Admitting: Internal Medicine

## 2020-04-14 DIAGNOSIS — E042 Nontoxic multinodular goiter: Secondary | ICD-10-CM

## 2020-07-06 ENCOUNTER — Other Ambulatory Visit: Payer: Self-pay | Admitting: Nurse Practitioner

## 2020-07-06 DIAGNOSIS — Z1231 Encounter for screening mammogram for malignant neoplasm of breast: Secondary | ICD-10-CM

## 2020-07-22 ENCOUNTER — Ambulatory Visit: Payer: BC Managed Care – PPO

## 2020-08-16 ENCOUNTER — Other Ambulatory Visit: Payer: Self-pay

## 2020-08-16 ENCOUNTER — Ambulatory Visit
Admission: RE | Admit: 2020-08-16 | Discharge: 2020-08-16 | Disposition: A | Discharge status: Home / Self Care | Payer: BC Managed Care – PPO | Source: Ambulatory Visit | Attending: Nurse Practitioner | Admitting: Nurse Practitioner

## 2020-08-16 DIAGNOSIS — Z1231 Encounter for screening mammogram for malignant neoplasm of breast: Secondary | ICD-10-CM

## 2021-02-07 ENCOUNTER — Other Ambulatory Visit: Payer: Self-pay | Admitting: Internal Medicine

## 2021-02-07 DIAGNOSIS — E042 Nontoxic multinodular goiter: Secondary | ICD-10-CM

## 2021-05-09 ENCOUNTER — Ambulatory Visit
Admission: RE | Admit: 2021-05-09 | Discharge: 2021-05-09 | Disposition: A | Discharge status: Home / Self Care | Payer: BC Managed Care – PPO | Source: Ambulatory Visit | Attending: Internal Medicine | Admitting: Internal Medicine

## 2021-05-09 ENCOUNTER — Other Ambulatory Visit: Payer: Self-pay

## 2021-05-09 DIAGNOSIS — E042 Nontoxic multinodular goiter: Secondary | ICD-10-CM

## 2022-01-15 IMAGING — MG DIGITAL SCREENING BILAT W/ TOMO W/ CAD
6 of 10 series · 6 of 30 positions shown · non-contrast
Comparison: Previous exam(s).

CLINICAL DATA: Screening.

EXAM:
DIGITAL SCREENING BILATERAL MAMMOGRAM WITH TOMO AND CAD

[L MLO synth-2D (1 of 2)]
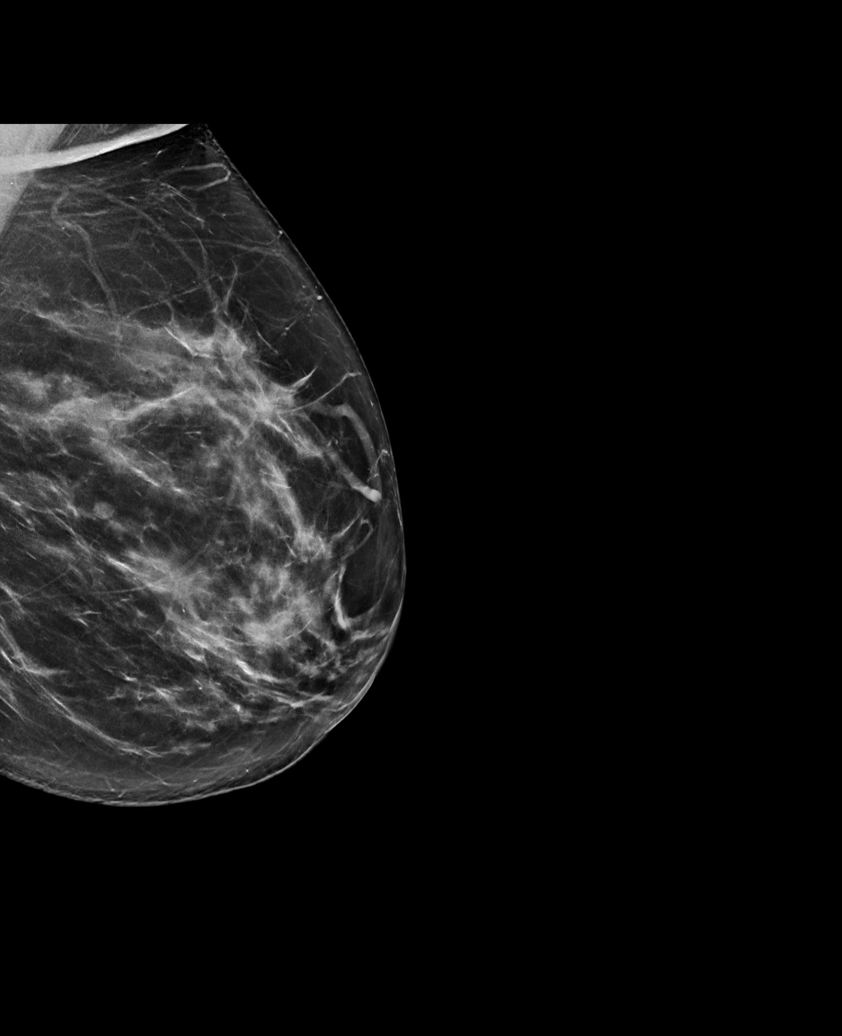

[L CC synth-2D]
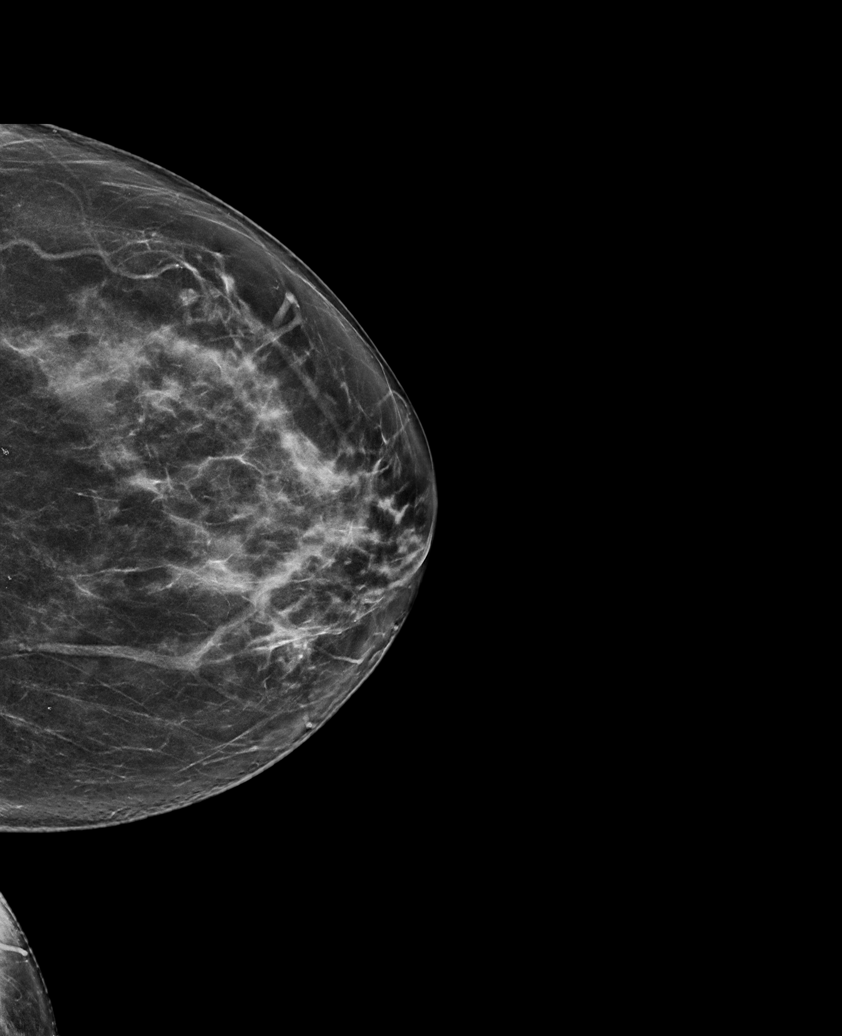

[R MLO synth-2D]
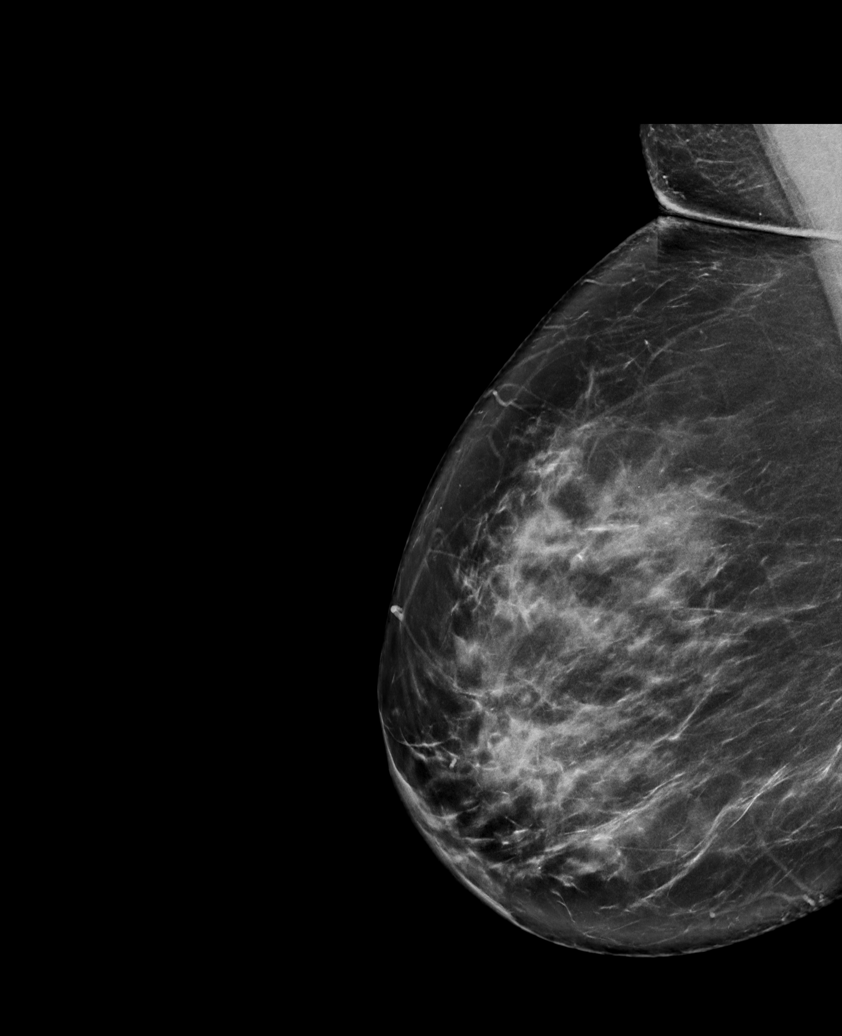

[L MLO synth-2D (2 of 2)]
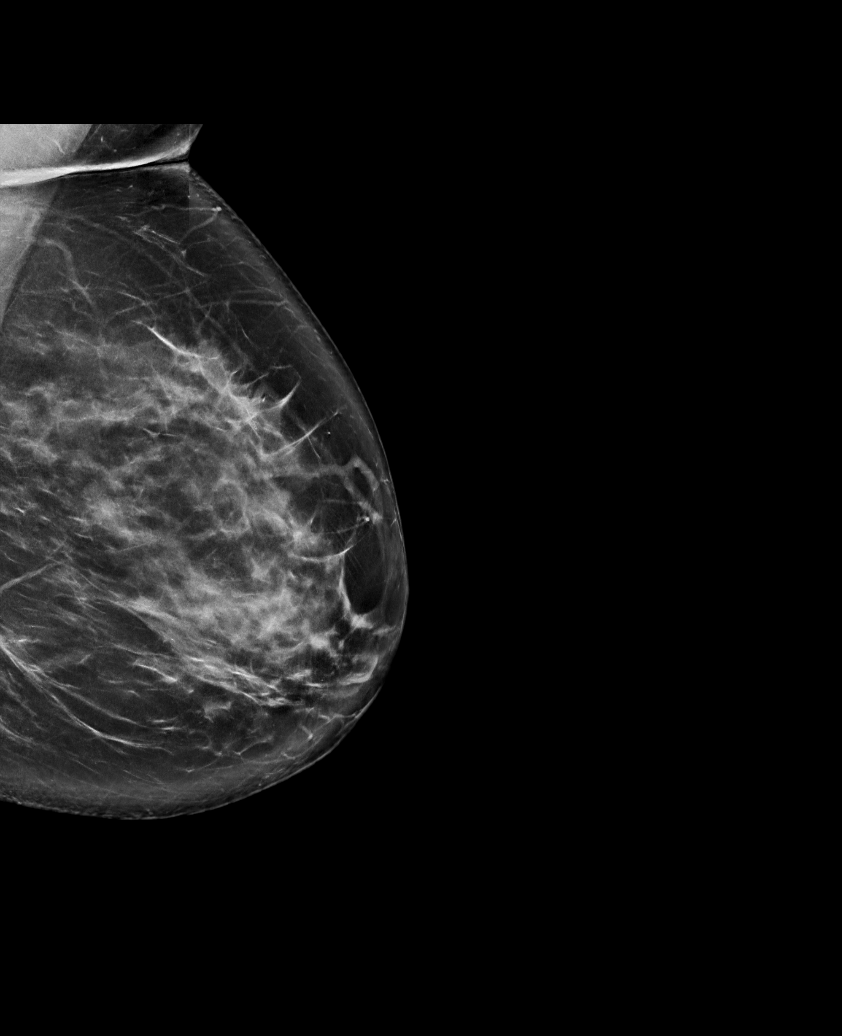

[R CC synth-2D]
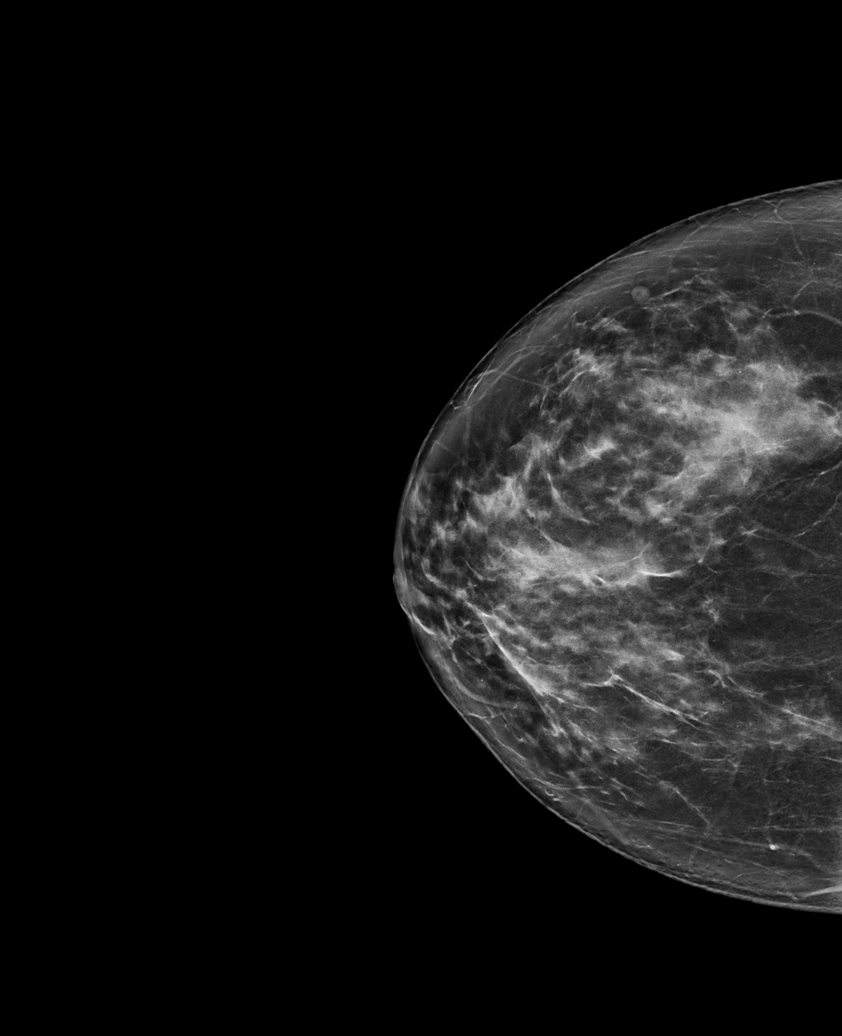

[L MLO tomo · tomo slice 40/79.0]
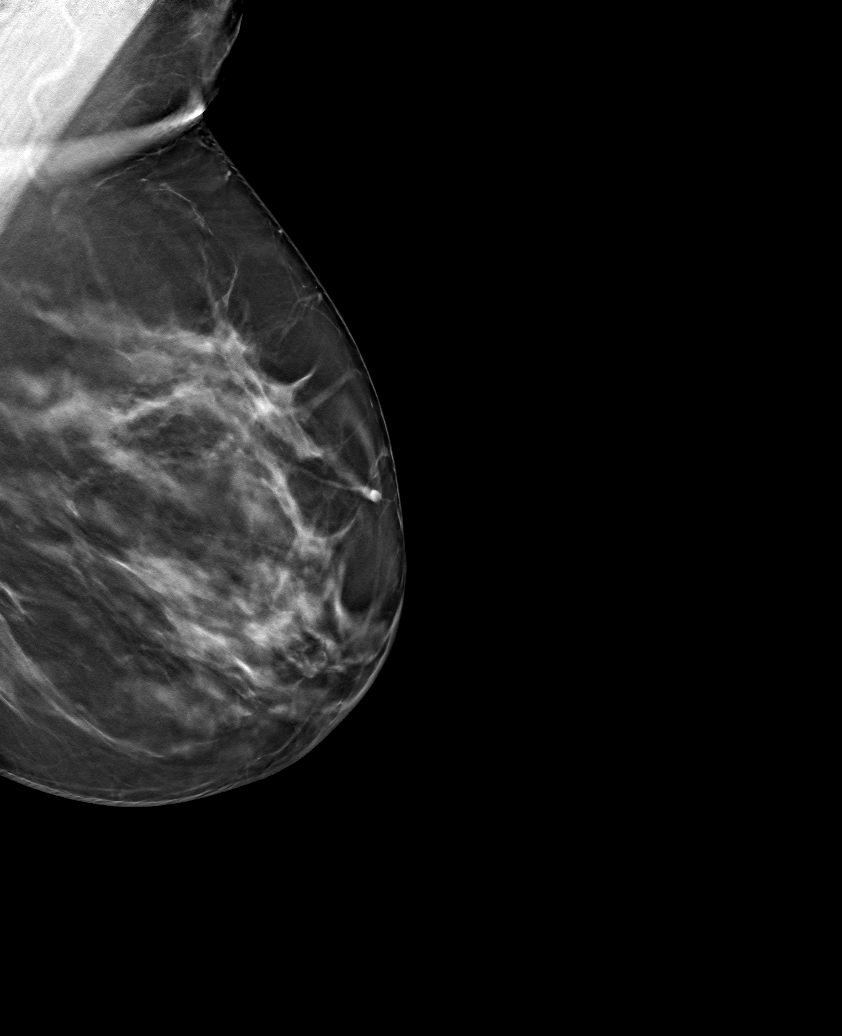

[6 of 30 positions shown; findings below may reference images not displayed]

ACR Breast Density Category c: The breast tissue is heterogeneously
dense, which may obscure small masses.
FINDINGS: There are no findings suspicious for malignancy. Images were
processed with CAD.
IMPRESSION: No mammographic evidence of malignancy. A result letter of this
screening mammogram will be mailed directly to the patient.

RECOMMENDATION:
Screening mammogram in one year. (Code:[id number]-U-LHB)

BI-RADS CATEGORY  1: Negative.

## 2022-05-18 ENCOUNTER — Other Ambulatory Visit (HOSPITAL_BASED_OUTPATIENT_CLINIC_OR_DEPARTMENT_OTHER): Payer: Self-pay | Admitting: Nurse Practitioner

## 2022-05-18 DIAGNOSIS — Z1231 Encounter for screening mammogram for malignant neoplasm of breast: Secondary | ICD-10-CM

## 2022-05-23 ENCOUNTER — Ambulatory Visit (HOSPITAL_BASED_OUTPATIENT_CLINIC_OR_DEPARTMENT_OTHER)
Admission: RE | Admit: 2022-05-23 | Discharge: 2022-05-23 | Disposition: A | Discharge status: Home / Self Care | Payer: BC Managed Care – PPO | Source: Ambulatory Visit | Attending: Nurse Practitioner | Admitting: Nurse Practitioner

## 2022-05-23 ENCOUNTER — Encounter (HOSPITAL_BASED_OUTPATIENT_CLINIC_OR_DEPARTMENT_OTHER): Payer: Self-pay

## 2022-05-23 DIAGNOSIS — Z1231 Encounter for screening mammogram for malignant neoplasm of breast: Secondary | ICD-10-CM | POA: Insufficient documentation

## 2022-10-08 IMAGING — US US THYROID
1 series · 13 of 25 positions shown · non-contrast
Comparison: 04/14/2020

CLINICAL DATA: Multiple thyroid nodules

EXAM:
THYROID ULTRASOUND
TECHNIQUE: Ultrasound examination of the thyroid gland and adjacent soft
tissues was performed.

[Series 1: us thyroid · 0.04mm/px · 13 of 75 slices shown]
[im 1/75]
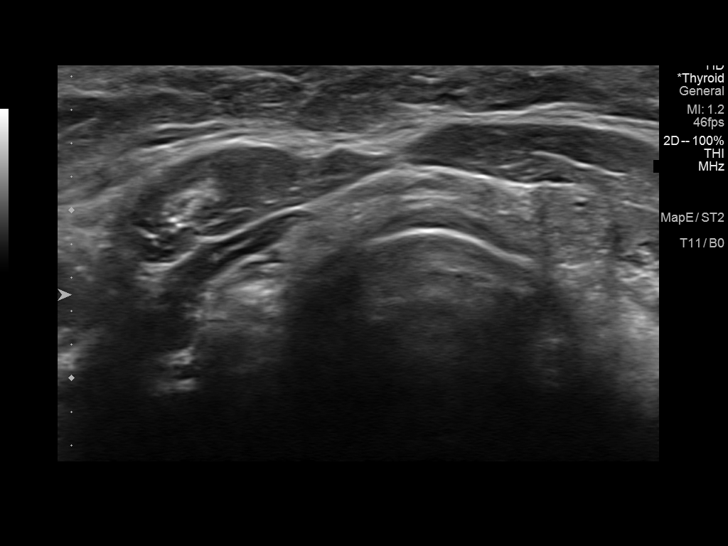
[im 7/75]
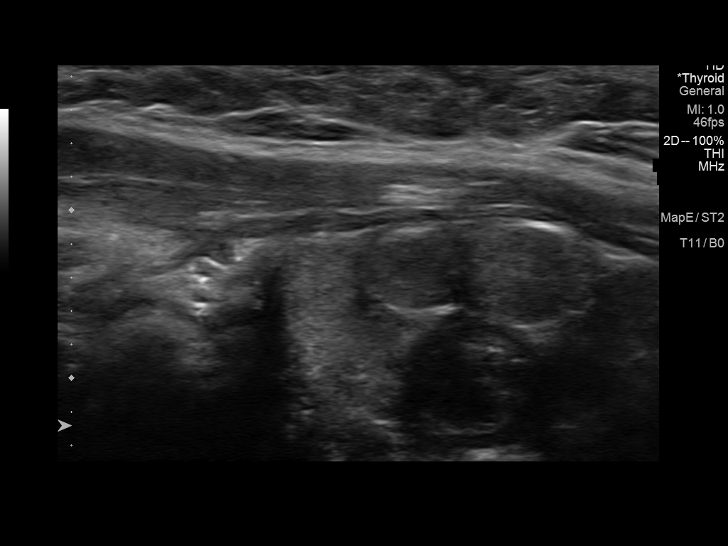
[im 13/75]
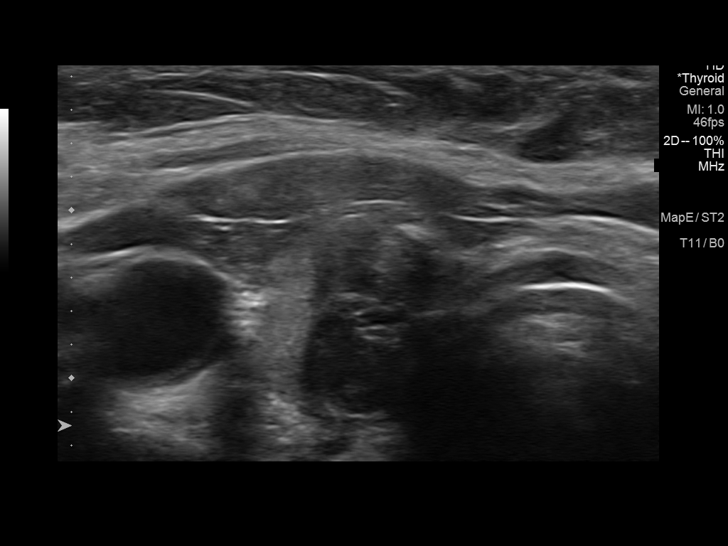
[im 19/75]
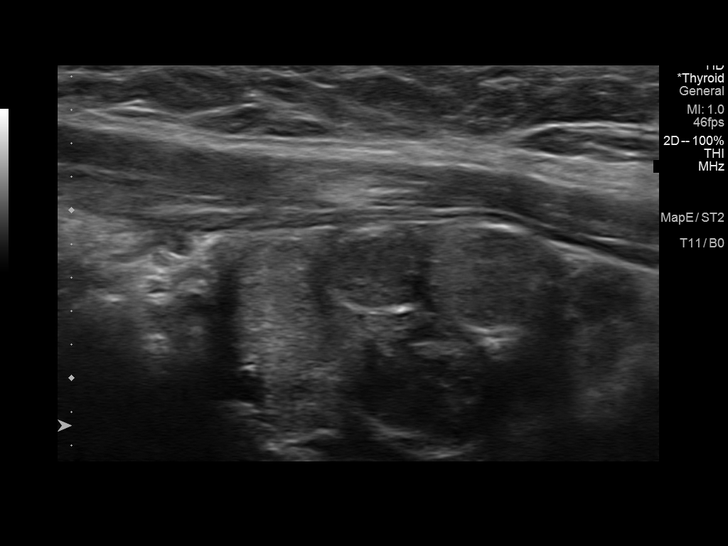
[im 25/75]
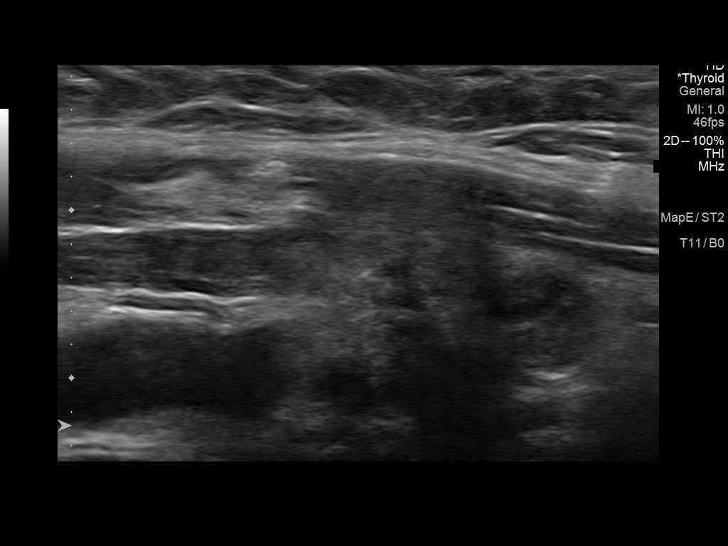
[im 31/75]
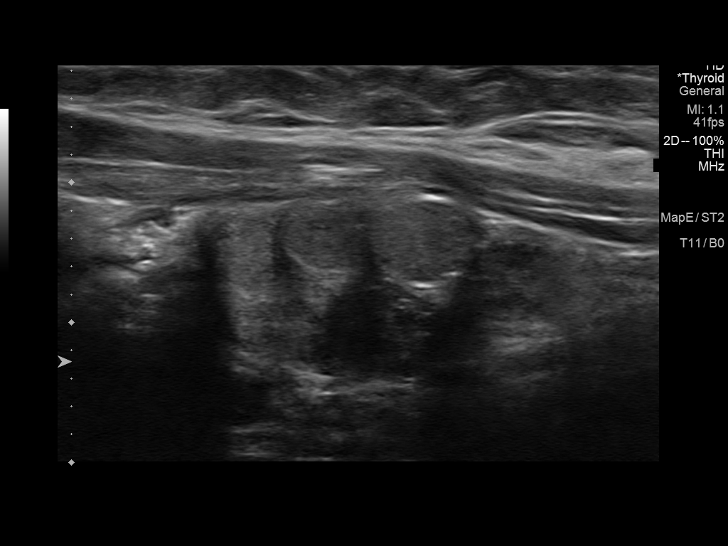
[im 38/75]
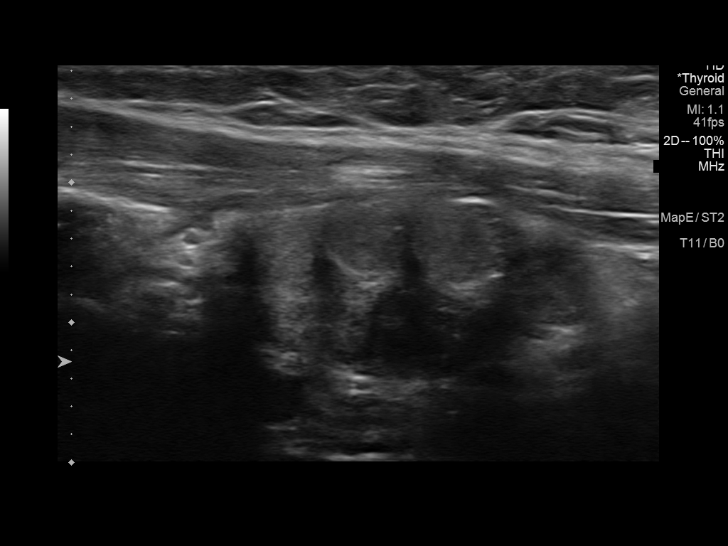
[im 44/75]
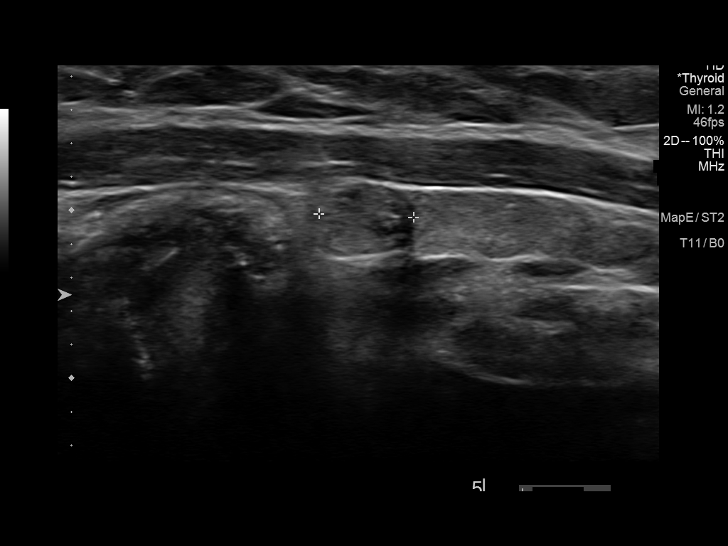
[im 50/75]
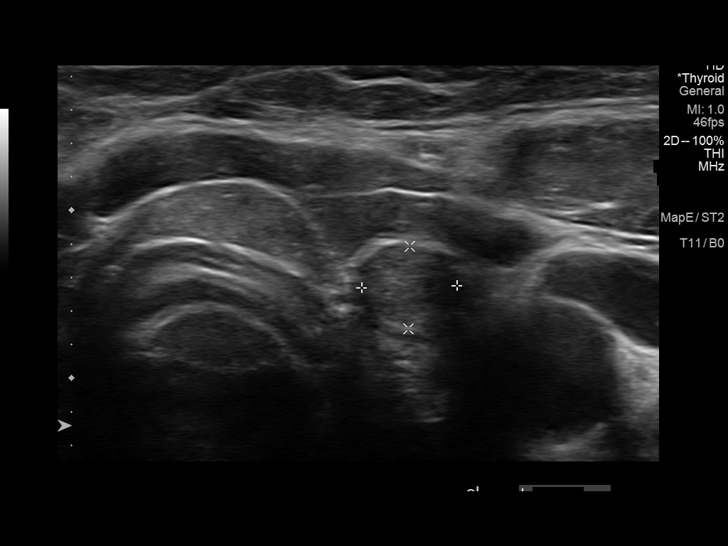
[im 56/75]
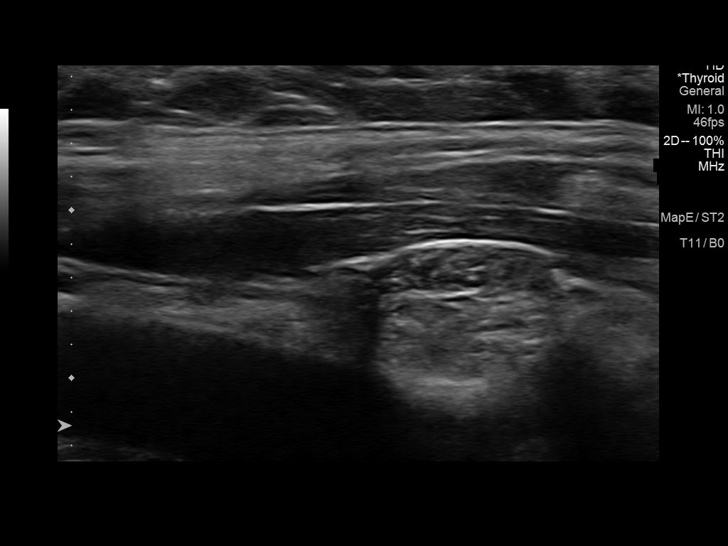
[im 62/75]
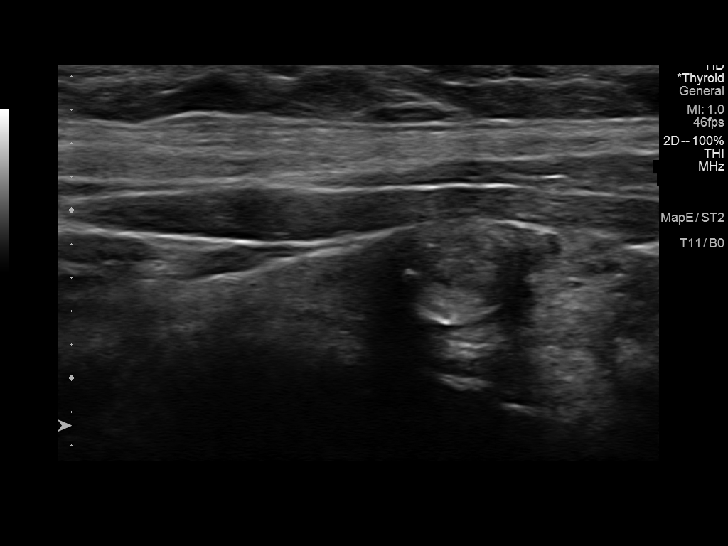
[im 68/75]
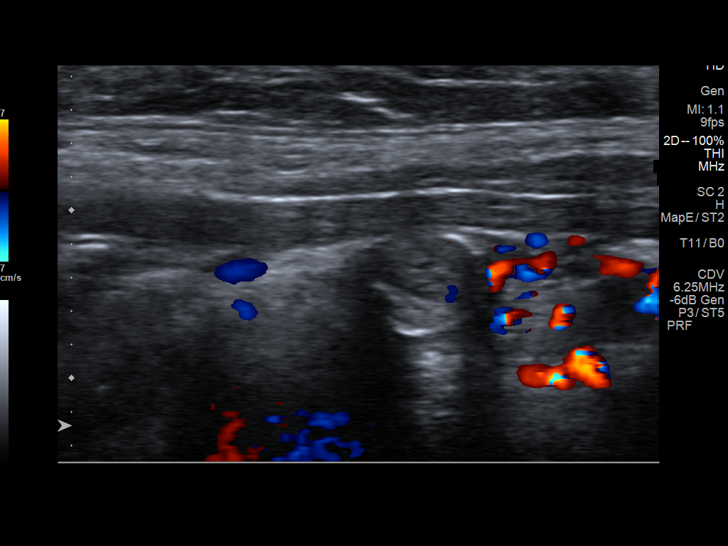
[im 75/75]
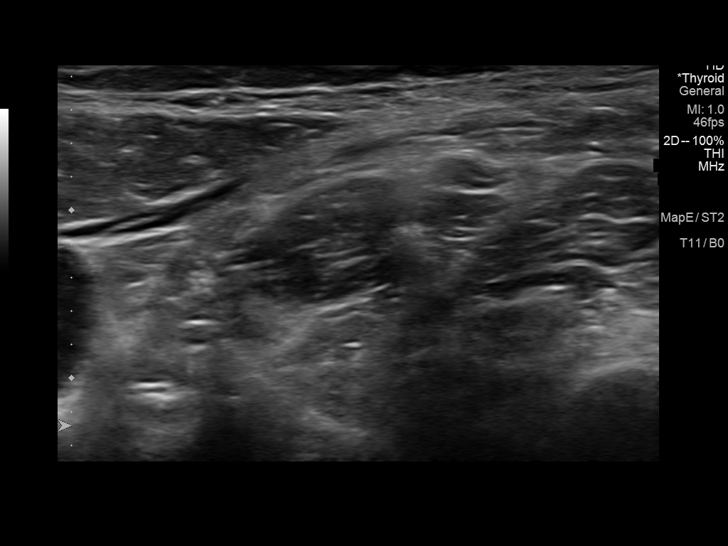

[13 of 25 positions shown; findings below may reference images not displayed]

FINDINGS: Parenchymal Echotexture: Moderately heterogeneous

Isthmus: 2 mm

Right lobe: 2.7 x 1.3 x 1.0 cm

Left lobe: 2.7 x 1.0 x 1.3 cm

_________________________________________________________

Estimated total number of nodules >/= 1 cm: 1

Number of spongiform nodules >/=  2 cm not described below (TR1): 0

Number of mixed cystic and solid nodules >/= 1.5 cm not described
below (TR2): 0

_________________________________________________________

Nodule # 7, previously labeled 6.:

Location: Left; Inferior

Maximum size: 1.1, previously 1.0 cm; Other 2 dimensions: 1.0 x
cm

Composition: solid/almost completely solid (2)

Echogenicity: isoechoic (1)

Shape: not taller-than-wide (0)

Margins: smooth (0)

Echogenic foci: none (0)

ACR TI-RADS total points: 3.

ACR TI-RADS risk category: TR3 (3 points).

ACR TI-RADS recommendations:

Given size (<1.4 cm) and appearance, this nodule does NOT meet
TI-RADS criteria for biopsy or dedicated follow-up.

_________________________________________________________

There are several bilateral subcentimeter isoechoic, mixed cystic
solid, and hypoechoic nodules noted all measuring 9 mm or less.
These would not meet criteria for any biopsy or follow-up and are
not fully described by TI rads criteria.

No hypervascularity.  No regional adenopathy.
IMPRESSION: 1.1 cm left inferior TR 3 nodule does not meet criteria for
continued follow-up.

Grossly stable scattered bilateral subcentimeter nodules.

The above is in keeping with the ACR TI-RADS recommendations - [HOSPITAL] 1965;[DATE].

## 2023-04-16 ENCOUNTER — Other Ambulatory Visit (HOSPITAL_BASED_OUTPATIENT_CLINIC_OR_DEPARTMENT_OTHER): Payer: Self-pay | Admitting: Physician Assistant

## 2023-04-16 DIAGNOSIS — Z1231 Encounter for screening mammogram for malignant neoplasm of breast: Secondary | ICD-10-CM

## 2023-05-27 ENCOUNTER — Inpatient Hospital Stay (HOSPITAL_BASED_OUTPATIENT_CLINIC_OR_DEPARTMENT_OTHER): Admission: RE | Admit: 2023-05-27 | Payer: BC Managed Care – PPO | Source: Ambulatory Visit

## 2023-07-09 ENCOUNTER — Ambulatory Visit (HOSPITAL_BASED_OUTPATIENT_CLINIC_OR_DEPARTMENT_OTHER)
Admission: RE | Admit: 2023-07-09 | Discharge: 2023-07-09 | Disposition: A | Discharge status: Home / Self Care | Payer: BC Managed Care – PPO | Source: Ambulatory Visit | Attending: Physician Assistant | Admitting: Physician Assistant

## 2023-07-09 ENCOUNTER — Encounter (HOSPITAL_BASED_OUTPATIENT_CLINIC_OR_DEPARTMENT_OTHER): Payer: Self-pay

## 2023-07-09 DIAGNOSIS — Z1231 Encounter for screening mammogram for malignant neoplasm of breast: Secondary | ICD-10-CM | POA: Diagnosis present

## 2024-02-26 ENCOUNTER — Ambulatory Visit: Payer: Self-pay | Admitting: Internal Medicine

## 2024-03-27 ENCOUNTER — Other Ambulatory Visit: Payer: Self-pay

## 2024-03-27 ENCOUNTER — Encounter: Payer: Self-pay | Admitting: Internal Medicine

## 2024-03-27 ENCOUNTER — Ambulatory Visit: Payer: Self-pay | Admitting: Internal Medicine

## 2024-03-27 VITALS — BP 122/80 | HR 68 | Temp 99.0°F | Resp 20 | Ht 64.0 in | Wt 148.0 lb

## 2024-03-27 DIAGNOSIS — L508 Other urticaria: Secondary | ICD-10-CM

## 2024-03-27 DIAGNOSIS — Z880 Allergy status to penicillin: Secondary | ICD-10-CM

## 2024-03-27 DIAGNOSIS — J310 Chronic rhinitis: Secondary | ICD-10-CM

## 2024-03-27 MED ORDER — IPRATROPIUM BROMIDE 0.03 % NA SOLN: 2.0000 | Freq: Every evening | NASAL | 12 refills | Status: AC | PRN

## 2024-03-27 MED ORDER — NEFFY 2 MG/0.1ML NA SOLN: 1.0000 | NASAL | 1 refills | Status: AC | PRN

## 2024-03-27 NOTE — Progress Notes (Signed)
 NEW PATIENT Date of Service/Encounter:   03/27/2024 Referring provider: Yvonnie Heritage, FNP Primary care provider: Yvonnie Heritage, FNP  Subjective:  Lindsay Avila is a 53 y.o. female with a PMHx of hypothyroidism,  presenting today for evaluation of chronic rhinoconjunctivitis and concern for shellfish allergy. History obtained from: chart review and patient.   Discussed the use of AI scribe software for clinical note transcription with the patient, who gave verbal consent to proceed.  History of Present Illness   Lindsay Avila is a 53 year old female who presents with a suspected allergic reaction to seafood.  Approximately two months ago, she experienced an allergic reaction after consuming shrimp at a [place] buffet. The reaction began about an hour and a half after eating, with symptoms of itching and hives which generalized to trunk and extremities. She took two Benadryl tablets and the hives resolved by the next morning, with only slight residual marks. No trouble breathing, swelling, vomiting, diarrhea, or nausea during the reaction.  She has a history of eating seafood regularly without issues, including fish and shrimp. Since the initial reaction, she has consumed shrimp again about a month ago without any symptoms. She also ate fried fish yesterday without any adverse effects.  [employer]).  She regularly consumes nuts, including cashews and almonds, and has had peanut butter without any issues.  She does not eat mollusks very often though does occasionally eat scallops.  Has not had a scallop in several months.  She does not eat sesame regularly and does not believe she has had any sesame since that occurred.  She has seasonal allergies, primarily experiencing drainage and watery eyes, for which she takes Zyrtec every other day. She has tried azelastine nasal spray but found it unpleasant due to its bitter taste. She occasionally uses Mucinex for  congestion.  She reports an allergy to penicillin, which caused hives lasting two to three months, with the last exposure being over fifteen years ago. She also experiences localized swelling from bee stings but has no known allergies to fire ants.      Chart Review:  Reviewed PCP notes from referral, OV date 12/27/23: ate seafood and developed hives, 1 week later ate again and had itching  Past Medical History: Past Medical History:  Diagnosis Date   Genital herpes 2013   Hypothyroidism age 44's   Medication List:  Current Outpatient Medications  Medication Sig Dispense Refill   Azelastine HCl 137 MCG/SPRAY SOLN SPRAY ONE SPRAY BY NASAL ROUTE 2 TIMES DAILY. Nasal for 30 Days     ibuprofen  (ADVIL ,MOTRIN ) 600 MG tablet Take 1 tablet (600 mg total) by mouth every 6 (six) hours as needed. 30 tablet 0   levothyroxine (SYNTHROID, LEVOTHROID) 125 MCG tablet Take 125 mcg by mouth daily.       montelukast (SINGULAIR) 10 MG tablet Take 10 mg by mouth.     omeprazole (PRILOSEC) 40 MG capsule Take 40 mg by mouth.     valACYclovir  (VALTREX ) 1000 MG tablet Take 1,000 mg by mouth.     ascorbic acid (VITAMIN C) 500 MG tablet Take by mouth. (Patient not taking: Reported on 03/27/2024)     diclofenac Sodium (VOLTAREN) 1 % GEL Apply topically. (Patient not taking: Reported on 03/27/2024)     EPINEPHrine 0.3 mg/0.3 mL IJ SOAJ injection Inject 0.3 mg into the muscle. (Patient not taking: Reported on 03/27/2024)     levocetirizine (XYZAL) 5 MG tablet TAKE ONE TABLET BY MOUTH EVERY EVENING.  Oral for 90 Days (Patient not taking: Reported on 03/27/2024)     No current facility-administered medications for this visit.   Known Allergies:  Allergies  Allergen Reactions   Penicillins Hives   Past Surgical History: Past Surgical History:  Procedure Laterality Date   ABDOMINAL HYSTERECTOMY  07/28/12   Robotic   CYSTOSCOPY  07/28/2012   Procedure: CYSTOSCOPY;  Surgeon: Doren Gammons, MD;  Location: [facility];   Service: Gynecology;;   Family History: Family History  Problem Relation Age of Onset   Hypertension Mother    Arthritis Mother    Diabetes Father    Social History: Kiely lives in a townhouse built 15 years ago, no water  damage, hardwood and vinyl floors, gasoline, central AC, no pets, no roaches, not using dust mite covers on the pillows but using on the bed.  No smoke exposure.  Works as a Hospital doctor for the school system.  Not exposed to fumes chemicals or dust at her job or her hobbies.  No HEPA filter in the home.  [place]  ROS:  All other systems negative except as noted per HPI.  Objective:  Blood pressure 122/80, pulse 68, temperature 99 F (37.2 C), temperature source Oral, resp. rate 20, height 5' 4 (1.626 m), weight 148 lb (67.1 kg), last menstrual period 06/26/2012, SpO2 98%. Body mass index is 25.4 kg/m. Physical Exam:  General Appearance:  Alert, cooperative, no distress, appears stated age  Head:  Normocephalic, without obvious abnormality, atraumatic  Eyes:  Conjunctiva clear, EOM's intact  Ears EACs normal bilaterally and normal TMs bilaterally  Nose: Nares normal, hypertrophic turbinates, normal mucosa, and no visible anterior polyps  Throat: Lips, tongue normal; teeth and gums normal, normal posterior oropharynx  Neck: Supple, symmetrical  Lungs:   clear to auscultation bilaterally, Respirations unlabored, no coughing  Heart:  regular rate and rhythm and no murmur, Appears well perfused  Extremities: No edema  Skin: Skin color, texture, turgor normal and no rashes or lesions on visualized portions of skin  Neurologic: No gross deficits   Diagnostics:  Labs:  Lab Orders  No laboratory test(s) ordered today     Assessment and Plan  Assessment and Plan    Concern for food allergies: hives after eating at [place] buffet Will cast a wide net since unclear source of reaction, considered mollusks (she was eating shrimp but has  had since and doesn't eat mollusks regularly, found in oyster sauce used widely in [place] cuisine), fish (has had whiting and croaker since, but not other fish types), sesame and tree nuts (has had peanuts, and cashew and almonds since without symptoms) - Prescribed nasal epinephrine spray from [facility]. Discussed nasal spray versus injection, noting nasal spray may be less expensive but requires coordination with a send-out pharmacy. - Scheduled skin prick testing for shellfish, fish, sesame, and other potential allergens on Friday at 10:45 AM. - Instructed to avoid Zyrtec three days prior to testing. - Advised to avoid consuming shellfish, sesame, unfamiliar fish, and unfamiliar tree nuts until after testing.  Seasonal allergies Persistent symptoms despite Zyrtec. Azelastine nasal spray not tolerated. - Prescribed ipratropium nasal spray for nighttime use to manage nasal drainage. Discussed potential for drying nasal passages and advised use as needed. 1-2 spray in each nostril nightly as needed - Offered environmental allergy testing during scheduled skin prick testing.  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 - Recommended penicillin allergy testing and potential amoxicillin challenge at  a future visit.  Follow-up - Scheduled follow-up appointment for allergy testing on Friday, April 03, 2024, at 10:45 AM. (1-55, fish, shellfish, tree nuts, sesame) It was a pleasure meeting you in clinic today! Thank you for allowing me to participate in your care.  Jonathon Neighbors, MD [facility]      This note in its entirety was forwarded to the Provider who requested this consultation.  Other: none  Thank you for your kind referral. I appreciate the opportunity to take part in Lindsay Avila's care. Please do not hesitate to contact me with questions.  Sincerely,  Jonathon Neighbors, MD [facility] 

## 2024-03-27 NOTE — Patient Instructions (Addendum)
 Concern for food allergies: hives after eating at [place] buffet Will cast a wide net since unclear source of reaction, considered mollusks (she was eating shrimp but has had since and doesn't eat mollusks regularly, found in oyster sauce used widely in [place] cuisine), fish (has had whiting and croaker since, but not other fish types), sesame and tree nuts (has had peanuts, and cashew and almonds since without symptoms) - Prescribed nasal epinephrine spray from [facility]. Discussed nasal spray versus injection, noting nasal spray may be less expensive but requires coordination with a send-out pharmacy. - Scheduled skin prick testing for shellfish, fish, sesame, and other potential allergens on Friday at 10:45 AM. - Instructed to avoid Zyrtec three days prior to testing. - Advised to avoid consuming shellfish, sesame, unfamiliar fish, and unfamiliar tree nuts until after testing.  Seasonal allergies Persistent symptoms despite Zyrtec. Azelastine nasal spray not tolerated. - Prescribed ipratropium nasal spray for nighttime use to manage nasal drainage. Discussed potential for drying nasal passages and advised use as needed. 1-2 spray in each nostril nightly as needed - Offered environmental allergy testing during scheduled skin prick testing.  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 - Recommended penicillin allergy testing and potential amoxicillin challenge at a future visit.  Follow-up - Scheduled follow-up appointment for allergy testing on Friday, April 03, 2024, at 10:45 AM. (1-55, fish, shellfish, tree nuts, sesame) It was a pleasure meeting you in clinic today! Thank you for allowing me to participate in your care.  Jonathon Neighbors, MD [facility]

## 2024-04-03 ENCOUNTER — Ambulatory Visit: Admitting: Internal Medicine

## 2024-04-03 ENCOUNTER — Encounter: Payer: Self-pay | Admitting: Internal Medicine

## 2024-04-03 DIAGNOSIS — T7800XD Anaphylactic reaction due to unspecified food, subsequent encounter: Secondary | ICD-10-CM

## 2024-04-03 DIAGNOSIS — L508 Other urticaria: Secondary | ICD-10-CM

## 2024-04-03 DIAGNOSIS — J3089 Other allergic rhinitis: Secondary | ICD-10-CM | POA: Diagnosis not present

## 2024-04-03 DIAGNOSIS — J302 Other seasonal allergic rhinitis: Secondary | ICD-10-CM | POA: Diagnosis not present

## 2024-04-03 DIAGNOSIS — T7800XA Anaphylactic reaction due to unspecified food, initial encounter: Secondary | ICD-10-CM | POA: Insufficient documentation

## 2024-04-03 MED ORDER — AMOXICILLIN 400 MG/5ML PO SUSR: ORAL | 0 refills | Status: AC

## 2024-04-03 MED ORDER — NEFFY 2 MG/0.1ML NA SOLN: 1.0000 | NASAL | 1 refills | Status: AC | PRN

## 2024-04-03 NOTE — Progress Notes (Signed)
 Date of Service/Encounter:  04/03/24  Allergy testing appointment   Initial visit on 03/27/24, seen for hives after eating at chinese buffet, seasonal allergies, penicillin allergy.  Please see that note for additional details.  Today reports for allergy diagnostic testing:    DIAGNOSTICS:  Skin Testing: Environmental allergy panel and select foods. Adequate positive and negative controls. Results discussed with patient/family.  Airborne Adult Perc - 04/03/24 1100     Time Antigen Placed 1115    Allergen Manufacturer Floyd Hutchinson    Location Back    Number of Test 55    1. Control-Buffer 50% Glycerol Negative    2. Control-Histamine 3+    3. Bahia Negative    4. French Southern Territories Negative    5. Johnson Negative    6. [place]  Blue Negative    7. Meadow Fescue Negative    8. Perennial Rye Negative    9. Timothy Negative    10. Ragweed Mix Negative    11. Cocklebur Negative    12. Plantain,  English 3+    13. Baccharis 3+    14. Dog Fennel Negative    15. Russian Thistle 3+    16. Lamb's Quarters Negative    17. Sheep Sorrell Negative    18. Rough Pigweed Negative    19. Marsh Elder, Rough Negative    20. Mugwort, Common Negative    21. Box, Elder 3+    22. Cedar, red 3+    23. Sweet Gum Negative    24. Pecan Pollen 4+    25. Pine Mix Negative    26. Walnut, Black Pollen 3+    27. Red Mulberry Negative    28. Ash Mix Negative    29. Birch Mix Negative    30. Beech American Negative    31. Cottonwood, Guinea-Bissau Negative    32. Hickory, White 4+    33. Maple Mix Negative    34. Oak, Guinea-[place]iss[place] Mix Negative    35. Sycamore Eastern Negative    36. Alternaria Alternata Negative    37. Cladosporium Herbarum Negative    38. Aspergillus Mix Negative    39. Penicillium Mix Negative    40. Bipolaris Sorokiniana (Helminthosporium) 3+    41. Drechslera Spicifera (Curvularia) 3+    42. Mucor Plumbeus Negative    43. Fusarium Moniliforme Negative    44. Aureobasidium Pullulans (pullulara)  Negative    45. Rhizopus Oryzae Negative    46. Botrytis Cinera Negative    47. Epicoccum Nigrum 3+    48. Phoma Betae Negative    49. Dust Mite Mix Negative    50. Cat Hair 10,000 BAU/ml Negative    51.  Dog Epithelia Negative    52. Mixed Feathers Negative    53. Horse Epithelia Negative    54. Cockroach, German 4+    55. Tobacco Leaf Negative          Intradermal - 04/03/24 1224     Allergen Manufacturer Floyd Hutchinson    Location Back    Number of Test 10    Control Negative    Bahia Negative    French Southern Territories Negative    Johnson Negative    7 Grass Negative    Ragweed Mix Negative    Mold 1 Negative    Mold 2 Negative    Mold 4 Negative    Mite Mix 4+    Dog Negative          Food Adult Perc - 04/03/24 1100  Time Antigen Placed 1115    Location Back    Number of allergen test 18    4. Sesame Negative    10. Cashew Negative    11. Walnut Food Negative    12. Almond Negative    13. Hazelnut Negative    14. Pecan Food Negative    15. Pistachio Negative    16. [place] Nut Negative    18. Trout Negative    19. Tuna Negative    20. Salmon Negative    21. Flounder Negative    22. Codfish Negative    23. Shrimp --   17x35   24. Crab Negative    25. Lobster --   10x25   26. Oyster Negative    27. Scallops Negative          Allergy testing results were read and interpreted by myself, documented by clinical staff.  Patient provided with copy of allergy testing along with avoidance measures when indicated.   Jonathon Neighbors, MD  [facility]     Neffy sent to [facility] printed to be filled prior to penicillin allergy appointment.

## 2024-04-03 NOTE — Patient Instructions (Signed)
 Shellfish Allergy-hives after eating at [employer] Will cast a wide net since unclear source of reaction, considered mollusks (she was eating shrimp but has had since and doesn't eat mollusks regularly, found in oyster sauce used widely in [place] cuisine), fish (has had whiting and croaker since, but not other fish types), sesame and tree nuts (has had peanuts, and cashew and almonds since without symptoms) - Prescribed nasal epinephrine spray from [facility]. Discussed nasal spray versus injection, noting nasal spray may be less expensive but requires coordination with a send-out pharmacy. - allergy testing 04/03/24: positive to shrimp and lobster;  labs for confirmation. - avoidance of shellfish-will obtain labs to see if would possibly tolerate mollusks. For now avoid all shellfish.  Seasonal allergies Persistent symptoms despite Zyrtec. Azelastine nasal spray not tolerated. - Prescribed ipratropium nasal spray for nighttime use to manage nasal drainage. Discussed potential for drying nasal passages and advised use as needed. 1-2 spray in each nostril nightly as needed - skin testing 04/03/24: Positive to weed pollen, tree pollen, minor molds, cat, cockroach; IDs positive to dust mites only  Penicillin allergy History of penicillin allergy with potential for outgrowing. - Recommended penicillin allergy testing and potential amoxicillin challenge at a future visit.  Follow-up 3 months, sooner if needed.  It was a pleasure meeting you in clinic today! Thank you for allowing me to participate in your care.  Jonathon Neighbors, MD [facility]  .[place] Control of Mold Allergen   Mold and fungi can grow on a variety of surfaces provided certain temperature and moisture conditions exist.  Outdoor molds grow on plants, decaying vegetation and soil.  The major outdoor mold, Alternaria and Cladosporium, are found in very high numbers during hot and dry conditions.   Generally, a late Summer - Fall peak is seen for common outdoor fungal spores.  Rain will temporarily lower outdoor mold spore count, but counts rise rapidly when the rainy period ends.  The most important indoor molds are Aspergillus and Penicillium.  Dark, humid and poorly ventilated basements are ideal sites for mold growth.  The next most common sites of mold growth are the bathroom and the kitchen.  Outdoor (Seasonal) Mold Control  Use air conditioning and keep windows closed Avoid exposure to decaying vegetation. Avoid leaf raking. Avoid grain handling. Consider wearing a face mask if working in moldy areas.    Indoor (Perennial) Mold Control   Maintain humidity below 50%. Clean washable surfaces with 5% bleach solution. Remove sources e.g. contaminated carpets.   DUST MITE AVOIDANCE MEASURES:  There are three main measures that need and can be taken to avoid house dust mites:  Reduce accumulation of dust in general -reduce furniture, clothing, carpeting, books, stuffed animals, especially in bedroom  Separate yourself from the dust -use pillow and mattress encasements (can be found at stores such as [employer] or online) -avoid direct exposure to air condition flow -use a HEPA filter device, especially in the bedroom; you can also use a HEPA filter vacuum cleaner -wipe dust with a moist towel instead of a dry towel or broom when cleaning  Decrease mites and/or their secretions -wash clothing and linen and stuffed animals at highest temperature possible, at least every 2 weeks -stuffed animals can also be placed in a bag and put in a freezer overnight  Despite the above measures, it is impossible to eliminate dust mites or their allergen completely from your home.  With the above measures  the burden of mites in your home can be diminished, with the goal of minimizing your allergic symptoms.  Success will be reached only when implementing and using all means  together. Control of Dog or Cat Allergen  Avoidance is the best way to manage a dog or cat allergy. If you have a dog or cat and are allergic to dog or cats, consider removing the dog or cat from the home. If you have a dog or cat but don't want to find it a new home, or if your family wants a pet even though someone in the household is allergic, here are some strategies that may help keep symptoms at bay:  Keep the pet out of your bedroom and restrict it to only a few rooms. Be advised that keeping the dog or cat in only one room will not limit the allergens to that room. Don't pet, hug or kiss the dog or cat; if you do, wash your hands with soap and water . High-efficiency particulate air (HEPA) cleaners run continuously in a bedroom or living room can reduce allergen levels over time. Regular use of a high-efficiency vacuum cleaner or a central vacuum can reduce allergen levels. Giving your dog or cat a bath at least once a week can reduce airborne allergen. Control of Cockroach Allergen  Cockroach allergen has been identified as an important cause of acute attacks of asthma, especially in urban settings.  There are fifty-five species of cockroach that exist in the [place] , however only three, the [place], [place] and [place] species produce allergen that can affect patients with Asthma.  Allergens can be obtained from fecal particles, egg casings and secretions from cockroaches.    Remove food sources. Reduce access to water . Seal access and entry points. Spray runways with 0.5-1% Diazinon or Chlorpyrifos Blow boric acid power under stoves and refrigerator. Place bait stations (hydramethylnon) at feeding sites.

## 2024-04-06 ENCOUNTER — Ambulatory Visit: Payer: Self-pay | Admitting: Internal Medicine

## 2024-04-06 LAB — ALLERGEN PROFILE, SHELLFISH
Clam IgE: <0.1 kU/L
F023-IgE Crab: <0.1 kU/L
F080-IgE Lobster: <0.1 kU/L
F290-IgE Oyster: <0.1 kU/L
Scallop IgE: <0.1 kU/L
Shrimp IgE: 1.59 kU/L — AB

## 2024-06-05 ENCOUNTER — Other Ambulatory Visit (HOSPITAL_BASED_OUTPATIENT_CLINIC_OR_DEPARTMENT_OTHER): Payer: Self-pay | Admitting: Nurse Practitioner

## 2024-06-05 DIAGNOSIS — Z1231 Encounter for screening mammogram for malignant neoplasm of breast: Secondary | ICD-10-CM

## 2024-07-14 ENCOUNTER — Encounter (HOSPITAL_BASED_OUTPATIENT_CLINIC_OR_DEPARTMENT_OTHER): Payer: Self-pay

## 2024-07-14 ENCOUNTER — Ambulatory Visit (HOSPITAL_BASED_OUTPATIENT_CLINIC_OR_DEPARTMENT_OTHER)
Admission: RE | Admit: 2024-07-14 | Discharge: 2024-07-14 | Disposition: A | Discharge status: Home / Self Care | Payer: Self-pay | Source: Ambulatory Visit | Attending: Nurse Practitioner | Admitting: Nurse Practitioner

## 2024-07-14 DIAGNOSIS — Z1231 Encounter for screening mammogram for malignant neoplasm of breast: Secondary | ICD-10-CM | POA: Insufficient documentation
